# Patient Record
Sex: Male | Born: 1980 | Race: Black or African American | Hispanic: No | Marital: Single | Smoking: Current every day smoker
Health system: Southern US, Community
[De-identification: ages and names within clinical notes are randomized; demographics above are authoritative.]

## PROBLEM LIST (undated history)

## (undated) DIAGNOSIS — M1990 Unspecified osteoarthritis, unspecified site: Secondary | ICD-10-CM

## (undated) HISTORY — DX: Unspecified osteoarthritis, unspecified site: M19.90

---
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
## 2002-08-05 ENCOUNTER — Emergency Department (HOSPITAL_COMMUNITY): Admission: EM | Admit: 2002-08-05 | Discharge: 2002-08-05 | Payer: Self-pay | Admitting: Emergency Medicine

## 2002-08-05 ENCOUNTER — Encounter: Payer: Self-pay | Admitting: Emergency Medicine

## 2002-08-29 ENCOUNTER — Emergency Department (HOSPITAL_COMMUNITY): Admission: EM | Admit: 2002-08-29 | Discharge: 2002-08-30 | Payer: Self-pay | Admitting: Emergency Medicine

## 2003-07-09 ENCOUNTER — Emergency Department (HOSPITAL_COMMUNITY): Admission: EM | Admit: 2003-07-09 | Discharge: 2003-07-09 | Payer: Self-pay

## 2003-07-17 ENCOUNTER — Emergency Department (HOSPITAL_COMMUNITY): Admission: EM | Admit: 2003-07-17 | Discharge: 2003-07-17 | Payer: Self-pay | Admitting: Emergency Medicine

## 2003-07-31 ENCOUNTER — Ambulatory Visit (HOSPITAL_COMMUNITY): Admission: RE | Admit: 2003-07-31 | Discharge: 2003-07-31 | Payer: Self-pay | Admitting: Orthopedic Surgery

## 2003-08-15 ENCOUNTER — Encounter: Admission: RE | Admit: 2003-08-15 | Discharge: 2003-11-13 | Payer: Self-pay | Admitting: Orthopedic Surgery

## 2012-08-27 ENCOUNTER — Observation Stay (HOSPITAL_COMMUNITY)
Admission: EM | Admit: 2012-08-27 | Discharge: 2012-08-29 | DRG: 552 | Disposition: A | Discharge status: Home / Self Care | Payer: No Typology Code available for payment source | Attending: Internal Medicine | Admitting: Internal Medicine

## 2012-08-27 ENCOUNTER — Encounter (HOSPITAL_COMMUNITY): Payer: Self-pay | Admitting: Emergency Medicine

## 2012-08-27 ENCOUNTER — Emergency Department (HOSPITAL_COMMUNITY): Payer: No Typology Code available for payment source

## 2012-08-27 DIAGNOSIS — M549 Dorsalgia, unspecified: Secondary | ICD-10-CM | POA: Diagnosis present

## 2012-08-27 DIAGNOSIS — F17210 Nicotine dependence, cigarettes, uncomplicated: Secondary | ICD-10-CM

## 2012-08-27 DIAGNOSIS — M545 Low back pain, unspecified: Principal | ICD-10-CM | POA: Insufficient documentation

## 2012-08-27 DIAGNOSIS — F1210 Cannabis abuse, uncomplicated: Secondary | ICD-10-CM | POA: Insufficient documentation

## 2012-08-27 DIAGNOSIS — G8929 Other chronic pain: Secondary | ICD-10-CM | POA: Insufficient documentation

## 2012-08-27 DIAGNOSIS — R937 Abnormal findings on diagnostic imaging of other parts of musculoskeletal system: Secondary | ICD-10-CM | POA: Diagnosis present

## 2012-08-27 DIAGNOSIS — M79609 Pain in unspecified limb: Secondary | ICD-10-CM | POA: Insufficient documentation

## 2012-08-27 DIAGNOSIS — F17200 Nicotine dependence, unspecified, uncomplicated: Secondary | ICD-10-CM | POA: Insufficient documentation

## 2012-08-27 DIAGNOSIS — D649 Anemia, unspecified: Secondary | ICD-10-CM | POA: Insufficient documentation

## 2012-08-27 DIAGNOSIS — M546 Pain in thoracic spine: Secondary | ICD-10-CM

## 2012-08-27 DIAGNOSIS — R262 Difficulty in walking, not elsewhere classified: Secondary | ICD-10-CM | POA: Insufficient documentation

## 2012-08-27 LAB — CBC
HCT: 33.8 % — ABNORMAL LOW (ref 39.0–52.0)
Hemoglobin: 11.3 g/dL — ABNORMAL LOW (ref 13.0–17.0)
MCH: 28.6 pg (ref 26.0–34.0)
MCHC: 33.4 g/dL (ref 30.0–36.0)
MCV: 85.6 fL (ref 78.0–100.0)
Platelets: 256 10*3/uL (ref 150–400)
RBC: 3.95 MIL/uL — ABNORMAL LOW (ref 4.22–5.81)
RDW: 14 % (ref 11.5–15.5)
WBC: 7.4 10*3/uL (ref 4.0–10.5)

## 2012-08-27 LAB — URINALYSIS, ROUTINE W REFLEX MICROSCOPIC
Bilirubin Urine: NEGATIVE
Glucose, UA: NEGATIVE mg/dL
Hgb urine dipstick: NEGATIVE
Ketones, ur: NEGATIVE mg/dL
Leukocytes, UA: NEGATIVE
Nitrite: NEGATIVE
Protein, ur: NEGATIVE mg/dL
Specific Gravity, Urine: 1.018 (ref 1.005–1.030)
Urobilinogen, UA: 1 mg/dL (ref 0.0–1.0)
pH: 7.5 (ref 5.0–8.0)

## 2012-08-27 LAB — SEDIMENTATION RATE: Sed Rate: 59 mm/hr — ABNORMAL HIGH (ref 0–16)

## 2012-08-27 LAB — COMPREHENSIVE METABOLIC PANEL
ALT: 6 U/L (ref 0–53)
AST: 8 U/L (ref 0–37)
Albumin: 3.1 g/dL — ABNORMAL LOW (ref 3.5–5.2)
Alkaline Phosphatase: 82 U/L (ref 39–117)
BUN: 12 mg/dL (ref 6–23)
CO2: 28 mEq/L (ref 19–32)
Calcium: 9 mg/dL (ref 8.4–10.5)
Chloride: 102 mEq/L (ref 96–112)
Creatinine, Ser: 0.9 mg/dL (ref 0.50–1.35)
GFR calc Af Amer: >90 mL/min (ref >90)
GFR calc non Af Amer: >90 mL/min (ref >90)
Glucose, Bld: 85 mg/dL (ref 70–99)
Potassium: 3.7 mEq/L (ref 3.5–5.1)
Sodium: 138 mEq/L (ref 135–145)
Total Bilirubin: 0.1 mg/dL — ABNORMAL LOW (ref 0.3–1.2)
Total Protein: 6.8 g/dL (ref 6.0–8.3)

## 2012-08-27 LAB — URIC ACID: Uric Acid, Serum: 4.4 mg/dL (ref 4.0–7.8)

## 2012-08-27 MED ORDER — METHOCARBAMOL 500 MG PO TABS
500.0000 mg | ORAL_TABLET | Freq: Three times a day (TID) | ORAL | Status: DC
  Administered 2012-08-27 – 2012-08-29 (×5): 500 mg via ORAL
  Filled 2012-08-27 (×10): qty 1

## 2012-08-27 MED ORDER — DIAZEPAM 5 MG PO TABS
10.0000 mg | ORAL_TABLET | Freq: Once | ORAL | Status: AC
  Administered 2012-08-27: 10 mg via ORAL
  Filled 2012-08-27: qty 2

## 2012-08-27 MED ORDER — ACETAMINOPHEN 650 MG RE SUPP: 650.0000 mg | Freq: Four times a day (QID) | RECTAL | Status: DC | PRN

## 2012-08-27 MED ORDER — ONDANSETRON HCL 4 MG/2ML IJ SOLN: 4.0000 mg | Freq: Four times a day (QID) | INTRAMUSCULAR | Status: DC | PRN

## 2012-08-27 MED ORDER — ACETAMINOPHEN 325 MG PO TABS: 650.0000 mg | ORAL_TABLET | Freq: Four times a day (QID) | ORAL | Status: DC | PRN

## 2012-08-27 MED ORDER — BISACODYL 5 MG PO TBEC: 10.0000 mg | DELAYED_RELEASE_TABLET | Freq: Every day | ORAL | Status: DC | PRN

## 2012-08-27 MED ORDER — HYDROCODONE-ACETAMINOPHEN 5-325 MG PO TABS
2.0000 | ORAL_TABLET | Freq: Once | ORAL | Status: AC
  Administered 2012-08-27: 2 via ORAL
  Filled 2012-08-27: qty 2

## 2012-08-27 MED ORDER — PANTOPRAZOLE SODIUM 40 MG PO TBEC
40.0000 mg | DELAYED_RELEASE_TABLET | Freq: Two times a day (BID) | ORAL | Status: DC
  Administered 2012-08-27 – 2012-08-29 (×4): 40 mg via ORAL
  Filled 2012-08-27 (×4): qty 1

## 2012-08-27 MED ORDER — SODIUM CHLORIDE 0.9 % IV SOLN: 250.0000 mL | INTRAVENOUS | Status: DC | PRN

## 2012-08-27 MED ORDER — SODIUM CHLORIDE 0.9 % IJ SOLN: 3.0000 mL | INTRAMUSCULAR | Status: DC | PRN

## 2012-08-27 MED ORDER — NAPROXEN 500 MG PO TABS
500.0000 mg | ORAL_TABLET | Freq: Two times a day (BID) | ORAL | Status: DC
  Administered 2012-08-28 – 2012-08-29 (×3): 500 mg via ORAL
  Filled 2012-08-27 (×6): qty 1

## 2012-08-27 MED ORDER — ONDANSETRON HCL 4 MG PO TABS
4.0000 mg | ORAL_TABLET | Freq: Four times a day (QID) | ORAL | Status: DC | PRN
  Administered 2012-08-29: 4 mg via ORAL
  Filled 2012-08-27: qty 1

## 2012-08-27 MED ORDER — SODIUM CHLORIDE 0.9 % IJ SOLN
3.0000 mL | Freq: Two times a day (BID) | INTRAMUSCULAR | Status: DC
  Administered 2012-08-27 – 2012-08-29 (×4): 3 mL via INTRAVENOUS

## 2012-08-27 MED ORDER — OXYCODONE HCL 5 MG PO TABS
5.0000 mg | ORAL_TABLET | ORAL | Status: DC | PRN
  Administered 2012-08-28: 10 mg via ORAL
  Administered 2012-08-28: 5 mg via ORAL
  Administered 2012-08-28 (×2): 10 mg via ORAL
  Administered 2012-08-28: 5 mg via ORAL
  Administered 2012-08-28 – 2012-08-29 (×3): 10 mg via ORAL
  Filled 2012-08-27 (×2): qty 2
  Filled 2012-08-27 (×2): qty 1
  Filled 2012-08-27 (×4): qty 2

## 2012-08-27 MED ORDER — ENOXAPARIN SODIUM 40 MG/0.4ML ~~LOC~~ SOLN
40.0000 mg | SUBCUTANEOUS | Status: DC
  Administered 2012-08-27 – 2012-08-28 (×2): 40 mg via SUBCUTANEOUS
  Filled 2012-08-27 (×3): qty 0.4

## 2012-08-27 NOTE — ED Provider Notes (Signed)
CSN: [id number]     Arrival date & time 08/27/12  1211 History     First MD Initiated Contact with Patient 08/27/12 1234     Chief Complaint  Patient presents with  . Back Pain   (Consider location/radiation/quality/duration/timing/severity/associated sxs/prior Treatment) HPI Pt is a 32yo male with hx of chronic back pain c/o left leg pain that worsened this morning.  Pt states pain prevents him from walking, states he is unable to bear weight on left leg, as it causes increased pain in left hip.  Pain is constant, sharp, throbbing, aching, worse with walking, 10/10.  Has tried Aleve which provided moderate relief in past but none today. 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  Denies recent trauma or fall.  Denies fever, n/v/d, abdominal pain, or dysuria.    History reviewed. No pertinent past medical history. History reviewed. No pertinent past surgical history. History reviewed. No pertinent family history. History  Substance Use Topics  . Smoking status: Current Every Day Smoker    Types: Cigarettes  . Smokeless tobacco: Not on file  . Alcohol Use: Yes    Review of Systems  Constitutional: Negative for fever, chills and fatigue.  Cardiovascular: Negative for leg swelling.  Musculoskeletal: Positive for myalgias, back pain and arthralgias.       Left leg   Neurological: Negative for weakness and numbness.  All other systems reviewed and are negative.    Allergies  Aspirin  Home Medications   Current Outpatient Rx  Name  Route  Sig  Dispense  Refill  . naproxen sodium (ANAPROX) 220 MG tablet   Oral   Take 440 mg by mouth daily as needed (for pain).          BP 127/80  Pulse 80  Temp(Src) 98.2 F (36.8 C) (Oral)  Resp 18  SpO2 100% Physical Exam  Nursing note and vitals reviewed. Constitutional: He appears well-developed and well-nourished.  Pt sitting comfortably in  exam bed, NAD.     HENT:  Head: Normocephalic and atraumatic.  Eyes: Conjunctivae are normal. No scleral icterus.  Neck: Normal range of motion.  Cardiovascular: Normal rate, regular rhythm and normal heart sounds.   Pulmonary/Chest: Effort normal and breath sounds normal. No respiratory distress. He has no wheezes. He has no rales. He exhibits no tenderness.  Abdominal: Soft. Bowel sounds are normal. He exhibits no distension and no mass. There is no tenderness. There is no rebound and no guarding.  Musculoskeletal: Normal range of motion. He exhibits tenderness ( left lower lumbar muscles, left lateral thigh (mild)). He exhibits no edema.  Nl ROM at hip joint. No obvious deformity.  Neg straight leg raise. Neurovascularly in tact distal to area of pain.   Neurological: He is alert. A sensory deficit is present.  4/5 Left hip flexion, 5/5 right hip flexion. 5/5 bilateral plantar flexion and dorsiflexion   Skin: Skin is warm and dry.  Skin in tact, no ecchymosis, erythema, rashes or lesions    ED Course   Procedures (including critical care time)  Labs Reviewed  CBC - Abnormal; Notable for the following:    RBC 3.95 (*)    Hemoglobin 11.3 (*)    HCT 33.8 (*)    All other components within normal limits  CULTURE, BLOOD (ROUTINE X 2)  CULTURE, BLOOD (ROUTINE X 2)  COMPREHENSIVE METABOLIC PANEL  URINALYSIS, ROUTINE W REFLEX MICROSCOPIC  SEDIMENTATION RATE  RHEUMATOID FACTOR  ANA  URIC ACID   Mr Lumbar Spine Wo Contrast  08/27/2012   *RADIOLOGY REPORT*  Clinical Data: Severe left leg pain.  MRI LUMBAR SPINE WITHOUT CONTRAST  Technique:  Multiplanar and multiecho pulse sequences of the lumbar spine were obtained without intravenous contrast.  Comparison: Lumbar MRI dated 10/31/2003  Findings: Normal conus tip at L1-2.  The patient has unusual focal areas of joint inflammation and soft tissue inflammation including the paraspinal musculature at T11-12 and around the tip of the spinous  process of T10 as well as of the right facet joint at L2-3 and both facet joints at L4-5.  There is also a focal 11 mm lesion in the right pedicle of T12.  All of these findings are new since the prior study.  T10-11 through L5-S1:  The discs are all normal.  There is no bulging, protrusion, foraminal stenosis or spinal stenosis.  IMPRESSION:  1.  Multi focal facet joint inflammation in the lumbar spine, new since the prior study. 2.  Focal inflammation in the posterior soft tissues at T10-11 and T11-12. 3.  Small nonspecific lesion in the right pedicle of T12.  Possible inflammation at the left costovertebral joint at T11. 4.  The possibility of joint infections, possibly atypical, should be considered.  Systemic arthritis is felt to be less likely.  Has the patient had any trauma? 5.  Normal discs throughout the lower thoracic and lumbar spine.   Original Report Authenticated By: Francene Boyers, M.D.   No diagnosis found.  MDM  Pt has chronic LBP presenting to ED with left leg pain, requesting MRI.  Reviewed MRI from 2005, nl.  Pt was dx with lumbar strain.  No new injury to area.  Discussed pt with Dr. Ignacia Palma, will get repeat MR lumbar spine as pt claims unable to walk due to pain.    MRI lumbar spine: indicates multiple changes since last MR of lumbar spine including possibility of joint infections.   Discussed findings with Dr. Ignacia Palma who consulted Dr. Franky Macho, neurosurgery, for further direction.    Dr. Mikal Plane stating pt is not a surgical case at this time, suggested labs be drawn on pt: CBC, CMP, Sed Rate, Rheumatoid factor, ANA, uric acid, blood cultures, and UA.    4:44 PM Labs pending.  Signed out to [employer] PA-C at shift change.  Pt is to be admitted to medicine, stable enough for medsurg.     Junius Finner, PA-C 08/27/12 1644

## 2012-08-27 NOTE — H&P (Addendum)
[facility] History and Physical  RENDER MARLEY ZOX:[id number] DOB: 1980-10-21 DOA: 08/27/2012  Referring physician: PCP: No PCP Per Patient  Specialists:   Chief Complaint: severe L. Leg pain with difficulty ambulating  HPI: Jimmy Peterson is a 32 y.o. male with a long standing(since 2005) h/o  Back pain- no prior back sugeries and no trauma,  who presents with the above complaints. He states that today when he woke up the pain going down his leg(the side) was so severe that he was unable to bear weight on that leg- described as throbbing, esp. worse down his L. Hip and thigh and aggravated by walking. He tried taking aleve, but no relief. He denies any fall/trauma or recent heavy lifting. 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 Admits to some back pain on and off today, and intermittent tingling down his leg. States leg feels slightly weak, but that mostly the pain is making it difficult for him to walk. Denies fever, dysuria, n/v, and no URI or cold symptoms. He also denies  He states he played sports in High school but did some wt lifting but does not recall any significant injuries. In the ED MRI of lumbar spine Multi focal facet joint inflammation in the lumbar spine, Focal inflammation in the posterior soft tissues at T10-11 and T11-12., Small nonspecific lesion in the right pedicle of T12. Possible inflammation at the left costovertebral joint at T11- consider possibility of joint infections,  Systemic arthritis felt to be less likely. NS/Dr Franky Macho was consulted >> no indication for surgical intervention and recommended admission to [facility] to r/o infection .Pt is afebrile, WBC 7.4.     Review of Systems: The patient denies anorexia, fever, weight loss, vision loss, decreased hearing, hoarseness, chest pain, syncope, dyspnea on exertion, peripheral edema, balance deficits, hemoptysis, abdominal pain, melena, hematochezia, severe  indigestion/heartburn, hematuria, incontinence, genital sores, muscle weakness, suspicious skin lesions, transient blindness, difficulty walking, depression, unusual weight change, abnormal bleeding, enlarged lymph nodes, angioedema, and breast masses.    History reviewed. No pertinent past medical history. History reviewed. No pertinent past surgical history. Social History:  reports that he has been smoking Cigarettes.  He has a 3.25 pack-year smoking history. He does not have any smokeless tobacco history on file. He reports that  drinks alcohol. He reports that he uses illicit drugs (Marijuana) about once per week.  where does patient live--home Can patient participate in ADLs  Allergies  Allergen Reactions  . Aspirin Hives    Family History  Problem Relation Age of Onset  . Diabetes Mother   . Heart disease Mother     Prior to Admission medications   Medication Sig Start Date End Date Taking? Authorizing Provider  naproxen sodium (ANAPROX) 220 MG tablet Take 440 mg by mouth daily as needed (for pain).   Yes Historical Provider, MD   Physical Exam: Filed Vitals:   08/27/12 1700 08/27/12 1730 08/27/12 1815 08/27/12 1910  BP: 114/71 109/59 108/74 122/70  Pulse: 51 51 53 55  Temp:    97.4 F (36.3 C)  TempSrc:    Oral  Resp:   18 20  Height:   5\' 7"  (1.702 m)   Weight:   77.111 kg (170 lb)   SpO2: 100% 99% 100% 100%   Constitutional: Vital signs reviewed.  Patient is a well-developed and well-nourished in no acute distress and cooperative with exam. Alert and oriented x3.  Head: Normocephalic and atraumatic Mouth:  no erythema or exudates, MMM Eyes: PERRL, EOMI, conjunctivae normal(no erythema), No scleral icterus.  Neck: Supple, Trachea midline normal ROM, No JVD, mass, thyromegaly, or carotid bruit present.  Cardiovascular: RRR, S1 normal, S2 normal, no MRG, pulses symmetric and intact bilaterally Pulmonary/Chest: normal respiratory effort, CTAB, no wheezes, rales, or  rhonchi Abdominal: Soft. Non-tender, non-distended, bowel sounds are normal, no masses, organomegaly, or guarding present.  Back: tenderness from midthoracic spine down to upper lumbar spine,with paraspinal muscle tightness and decrease ROM Extremities: SLR only to about 5-10 degrees on L, to about 40degrees on R, no cyanosis and no edema Neurological: A&O x3, Strength 4-5/5, cranial nerve II-XII are grossly intact, no focal motor deficit, sensory intact to light touch bilaterally.  Skin: Warm, dry and intact. No rash, cyanosis.  Psychiatric: Normal mood and affect. speech and behavior is normal. Judgment and thought content normal. Cognition and memory are normal.      Labs on Admission:  Basic Metabolic Panel:  Recent Labs Lab 08/27/12 1601  NA 138  K 3.7  CL 102  CO2 28  GLUCOSE 85  BUN 12  CREATININE 0.90  CALCIUM 9.0   Liver Function Tests:  Recent Labs Lab 08/27/12 1601  AST 8  ALT 6  ALKPHOS 82  BILITOT 0.1*  PROT 6.8  ALBUMIN 3.1*   No results found for this basename: LIPASE, AMYLASE,  in the last 168 hours No results found for this basename: AMMONIA,  in the last 168 hours CBC:  Recent Labs Lab 08/27/12 1601  WBC 7.4  HGB 11.3*  HCT 33.8*  MCV 85.6  PLT 256   Cardiac Enzymes: No results found for this basename: CKTOTAL, CKMB, CKMBINDEX, TROPONINI,  in the last 168 hours  BNP (last 3 results) No results found for this basename: PROBNP,  in the last 8760 hours CBG: No results found for this basename: GLUCAP,  in the last 168 hours  Radiological Exams on Admission: Mr Lumbar Spine Wo Contrast  08/27/2012   *RADIOLOGY REPORT*  Clinical Data: Severe left leg pain.  MRI LUMBAR SPINE WITHOUT CONTRAST  Technique:  Multiplanar and multiecho pulse sequences of the lumbar spine were obtained without intravenous contrast.  Comparison: Lumbar MRI dated 10/31/2003  Findings: Normal conus tip at L1-2.  The patient has unusual focal areas of joint inflammation  and soft tissue inflammation including the paraspinal musculature at T11-12 and around the tip of the spinous process of T10 as well as of the right facet joint at L2-3 and both facet joints at L4-5.  There is also a focal 11 mm lesion in the right pedicle of T12.  All of these findings are new since the prior study.  T10-11 through L5-S1:  The discs are all normal.  There is no bulging, protrusion, foraminal stenosis or spinal stenosis.  IMPRESSION:  1.  Multi focal facet joint inflammation in the lumbar spine, new since the prior study. 2.  Focal inflammation in the posterior soft tissues at T10-11 and T11-12. 3.  Small nonspecific lesion in the right pedicle of T12.  Possible inflammation at the left costovertebral joint at T11. 4.  The possibility of joint infections, possibly atypical, should be considered.  Systemic arthritis is felt to be less likely.  Has the patient had any trauma? 5.  Normal discs throughout the lower thoracic and lumbar spine.   Original Report Authenticated By: Francene Boyers, M.D.     Assessment/Plan Active Problems: Back pain/Severe L. Leg pain with Abnormal MRI, thoracic spine -  admit to med surge for pain control and futher eval - Pt is afebrile with no leukcytosis or known predisposing factors to vertebral infections- will hold off abx for now, I have discussed pt with Dr Orvan Falconer and he agrees with no abx and to check with IR for possibility of aspiration>>culture - Dicussed pt with IR- Dr Fredia Sorrow and following review of films he states he does not see anything that can be aspirated per IR -will obtain ESR, CRP, ANA, Rh factor, HLA B27 and follow - as above per NS no indication for surgery - pain management-NSAIDs, muscle relaxers, narcotics prn and follow Marijuana abuse -sw consult for resources to quit Chronic back pain -pain management as above and follow   Code Status: full Family Communication: mother at bedside Disposition Plan: admit to med surge  Time  spent: >30  Kela Millin [facility] Pager [phone]  If 7PM-7AM, please contact night-coverage [email or web address] Password [facility] 08/27/2012, 8:52 PM

## 2012-08-27 NOTE — Progress Notes (Signed)
Met Patient at bedside.Case Manager role explained,patient verbalizes understanding.Patient reports he does not have a PCP. Resource sheet provided- to patient about the [facility] clinic on [address].No further Case Management needs identified.

## 2012-08-27 NOTE — ED Provider Notes (Signed)
Patient signed out to me by Junius Finner, PA-C, with the plan to admit the patient to medicine for further workup of back pain.  Neurosurgery has been consulted.  Not a surgical issue at this time.  They recommend medicine admit for infectious rule out.  Patient discussed with Dr. Suanne Marker, who will admit the patient.  Roxy Horseman, PA-C 08/27/12 1806

## 2012-08-27 NOTE — ED Notes (Signed)
Pt arrives to ed co left sided back pain. Pt sts seen for same over past 5 years.  Pt denies recent illness/injury/trauma.  Coax4, pmsx4. nad,

## 2012-08-28 DIAGNOSIS — F17210 Nicotine dependence, cigarettes, uncomplicated: Secondary | ICD-10-CM | POA: Diagnosis present

## 2012-08-28 DIAGNOSIS — F17200 Nicotine dependence, unspecified, uncomplicated: Secondary | ICD-10-CM

## 2012-08-28 DIAGNOSIS — D649 Anemia, unspecified: Secondary | ICD-10-CM | POA: Diagnosis present

## 2012-08-28 LAB — BASIC METABOLIC PANEL
BUN: 13 mg/dL (ref 6–23)
CO2: 29 mEq/L (ref 19–32)
Calcium: 8.5 mg/dL (ref 8.4–10.5)
Creatinine, Ser: 1.1 mg/dL (ref 0.50–1.35)
Glucose, Bld: 91 mg/dL (ref 70–99)
Sodium: 141 mEq/L (ref 135–145)

## 2012-08-28 LAB — ANA: Anti Nuclear Antibody(ANA): NEGATIVE

## 2012-08-28 LAB — RHEUMATOID FACTOR: Rheumatoid fact SerPl-aCnc: <10 IU/mL (ref <=14)

## 2012-08-28 NOTE — Consult Note (Signed)
[facility]    Date of Admission:  08/27/2012          Reason for Consult: Acute low back pain with radiation to left leg    Referring Physician: Dr. Gaynelle Adu  Principal Problem:   Abnormal MRI, thoracic spine Active Problems:   Back pain   Cigarette smoker   Normocytic anemia   . enoxaparin (LOVENOX) injection  40 mg Subcutaneous Q24H  . methocarbamol  500 mg Oral TID  . naproxen  500 mg Oral BID WC  . pantoprazole  40 mg Oral BID  . sodium chloride  3 mL Intravenous Q12H    Recommendations: 1. Observe off of antibiotics   Assessment: I reviewed his lumbar MRI with Dr. Malachy Moan. He also reviewed the films with Dr. Lonia Skinner. There is some inflammation and some of the thoracic and lumbar facet joints and 2 areas of soft tissue inflammation but no fluid to aspirate. All disc spaces and vertebrae appear normal. This is not the picture of vertebral infection and I would not start any empiric antibiotic therapy. I am not sure what is causing the inflammation but he seems to be significantly better today and I would simply continue observation off of antibiotics. He is asking when he might be able to go home so it appears that he is improving and can be followed up as an outpatient. Please call if I can be of further assistance.    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 He had a severe episode in 2005 and was incapacitated for about 3 months at home. His family finally convinced him to see a doctor who did all sorts of tests and told him it wasn't certain what was causing his pain. His pain slowly resolved and he was able to return to normal activities. He has had maybe 2 episodes of very brief, severe back pain in the intervening years. He woke yesterday with severe low back pain radiating into his left leg. He has not had any recent  injuries. He has not had any fever or chills and has a normal appetite. He was admitted last night and underwent MRI scanning. He is feeling significantly better this morning.   Review of Systems: Constitutional: negative Eyes: negative Ears, nose, mouth, throat, and face: negative Respiratory: negative Cardiovascular: negative Gastrointestinal: negative Genitourinary:negative Musculoskeletal:positive for back pain and muscle weakness, negative for myalgias, neck pain and stiff joints  History reviewed. No pertinent past medical history.  History  Substance Use Topics  . Smoking status: Current Every Day Smoker -- 0.25 packs/day for 13 years    Types: Cigarettes  . Smokeless tobacco: Not on file  . Alcohol Use: 0.0 oz/week    0 Cans of beer, 0 Shots of liquor per week    Family History  Problem Relation Age of Onset  . Diabetes Mother   . Heart disease Mother    Allergies  Allergen Reactions  . Aspirin Hives    OBJECTIVE: Blood pressure 108/59, pulse 58, temperature 98 F (36.7 C), temperature source Oral, resp. rate 17, height 5\' 7"  (1.702 m), weight 77.111 kg (170 lb), SpO2 100.00%. General: He is a healthy-appearing young male. He is walking in his room without obvious discomfort Skin: No rash Lymph nodes: No palpable adenopathy Lungs: Clear Cor: Regular S1 and S2 and no  murmurs Abdomen: Soft and nontender without masses Joints and extremities: No tenderness, swelling or redness over her spine. No acute abnormalities of any joints Lab Results  Component Value Date   WBC 7.4 08/27/2012   HGB 11.3* 08/27/2012   HCT 33.8* 08/27/2012   MCV 85.6 08/27/2012   PLT 256 08/27/2012   BMET    Component Value Date/Time   NA 141 08/28/2012 0645   K 3.8 08/28/2012 0645   CL 106 08/28/2012 0645   CO2 29 08/28/2012 0645   GLUCOSE 91 08/28/2012 0645   BUN 13 08/28/2012 0645   CREATININE 1.10 08/28/2012 0645   CALCIUM 8.5 08/28/2012 0645   GFRNONAA 88* 08/28/2012 0645   GFRAA >90  08/28/2012 0645   Lab Results  Component Value Date   CRP 4.0* 08/27/2012   Lab Results  Component Value Date   ESRSEDRATE 59* 08/27/2012   Microbiology: No results found for this or any previous visit (from the past 240 hour(s)).  Cliffton Asters, MD [facility] [phone] pager   [phone] cell 08/28/2012, 12:41 PM

## 2012-08-28 NOTE — Progress Notes (Addendum)
[facility] PROGRESS NOTE  Jimmy Peterson YNW:[id number] DOB: 1980/12/20 DOA: 08/27/2012 PCP: No PCP Per Patient  Assessment/Plan: Back pain/Severe L. Leg pain with Abnormal MRI, thoracic spine  -admitted to med surge for pain control and futher eval  - Pt remains afebrile with no leukcytosis or known predisposing factors to vertebral infections- continue to hold off abx for now, Dr Orvan Falconer and he agrees with no abx and to check with IR for possibility of aspiration>>culture  - Dicussed pt with IR on admission- Dr Fredia Sorrow and following review of films he states he does not see anything that can be aspirated per IR  -ESR, CRP, Rh factor- unremarkable, HLA B27 ANA pending - as above per NS no indication for surgery  - -Pt improving clinically, continue pain management-NSAIDs, muscle relaxers, narcotics prn and follow  -PT consult Marijuana abuse  -sw consulted on admission for resources to quit  Chronic back pain  -pain management as above and follow   Code Status: full Family Communication: mother at bedside Disposition Plan: to home when medicall y stable   Consultants:  ID eval pending  Procedures:  none  Antibiotics:  none  HPI/Subjective: Pt states decreased back  Objective: Filed Vitals:   08/27/12 1815 08/27/12 1910 08/27/12 2223 08/28/12 0601  BP: 108/74 122/70 110/66 108/59  Pulse: 53 55 56 58  Temp:  97.4 F (36.3 C) 98 F (36.7 C) 98 F (36.7 C)  TempSrc:  Oral Oral   Resp: 18 20 17 17   Height: 5\' 7"  (1.702 m)     Weight: 77.111 kg (170 lb)     SpO2: 100% 100% 100% 100%    Intake/Output Summary (Last 24 hours) at 08/28/12 1128 Last data filed at 08/28/12 0840  Gross per 24 hour  Intake    360 ml  Output      2 ml  Net    358 ml   Filed Weights   08/27/12 1815  Weight: 77.111 kg (170 lb)   Exam:  General: alert & oriented x  In NAD Cardiovascular: RRR, nl S1 s2 Respiratory: CTAB Abdomen: soft +BS NT/ND, no masses  palpable BACK: decreased tenderness over thoracic and lumbar spine Extremities: No cyanosis and no edema. LLE SLR now to 30 degrees     Data Reviewed: Basic Metabolic Panel:  Recent Labs Lab 08/27/12 1601 08/28/12 0645  NA 138 141  K 3.7 3.8  CL 102 106  CO2 28 29  GLUCOSE 85 91  BUN 12 13  CREATININE 0.90 1.10  CALCIUM 9.0 8.5   Liver Function Tests:  Recent Labs Lab 08/27/12 1601  AST 8  ALT 6  ALKPHOS 82  BILITOT 0.1*  PROT 6.8  ALBUMIN 3.1*   No results found for this basename: LIPASE, AMYLASE,  in the last 168 hours No results found for this basename: AMMONIA,  in the last 168 hours CBC:  Recent Labs Lab 08/27/12 1601  WBC 7.4  HGB 11.3*  HCT 33.8*  MCV 85.6  PLT 256   Cardiac Enzymes: No results found for this basename: CKTOTAL, CKMB, CKMBINDEX, TROPONINI,  in the last 168 hours BNP (last 3 results) No results found for this basename: PROBNP,  in the last 8760 hours CBG: No results found for this basename: GLUCAP,  in the last 168 hours  No results found for this or any previous visit (from the past 240 hour(s)).   Studies: Mr Lumbar Spine Wo Contrast  08/27/2012   *RADIOLOGY REPORT*  Clinical Data:  Severe left leg pain.  MRI LUMBAR SPINE WITHOUT CONTRAST  Technique:  Multiplanar and multiecho pulse sequences of the lumbar spine were obtained without intravenous contrast.  Comparison: Lumbar MRI dated 10/31/2003  Findings: Normal conus tip at L1-2.  The patient has unusual focal areas of joint inflammation and soft tissue inflammation including the paraspinal musculature at T11-12 and around the tip of the spinous process of T10 as well as of the right facet joint at L2-3 and both facet joints at L4-5.  There is also a focal 11 mm lesion in the right pedicle of T12.  All of these findings are new since the prior study.  T10-11 through L5-S1:  The discs are all normal.  There is no bulging, protrusion, foraminal stenosis or spinal stenosis.  IMPRESSION:   1.  Multi focal facet joint inflammation in the lumbar spine, new since the prior study. 2.  Focal inflammation in the posterior soft tissues at T10-11 and T11-12. 3.  Small nonspecific lesion in the right pedicle of T12.  Possible inflammation at the left costovertebral joint at T11. 4.  The possibility of joint infections, possibly atypical, should be considered.  Systemic arthritis is felt to be less likely.  Has the patient had any trauma? 5.  Normal discs throughout the lower thoracic and lumbar spine.   Original Report Authenticated By: Francene Boyers, M.D.    Scheduled Meds: . enoxaparin (LOVENOX) injection  40 mg Subcutaneous Q24H  . methocarbamol  500 mg Oral TID  . naproxen  500 mg Oral BID WC  . pantoprazole  40 mg Oral BID  . sodium chloride  3 mL Intravenous Q12H   Continuous Infusions:   Principal Problem:   Abnormal MRI, thoracic spine Active Problems:   Back pain   Cigarette smoker   Normocytic anemia    Time spent: 25    [facility]  [facility] Pager [phone]. If 7PM-7AM, please contact night-coverage at [email or web address], password [facility] 08/28/2012, 11:28 AM  LOS: 1 day

## 2012-08-29 LAB — BASIC METABOLIC PANEL
CO2: 30 mEq/L (ref 19–32)
Chloride: 102 mEq/L (ref 96–112)
Creatinine, Ser: 1.07 mg/dL (ref 0.50–1.35)

## 2012-08-29 MED ORDER — METHOCARBAMOL 500 MG PO TABS: 500.0000 mg | ORAL_TABLET | Freq: Three times a day (TID) | ORAL | Status: DC | PRN

## 2012-08-29 MED ORDER — OXYCODONE HCL 5 MG PO TABS: 5.0000 mg | ORAL_TABLET | ORAL | Status: DC | PRN

## 2012-08-29 MED ORDER — OMEPRAZOLE 40 MG PO CPDR: 40.0000 mg | DELAYED_RELEASE_CAPSULE | Freq: Every day | ORAL | Status: DC

## 2012-08-29 MED ORDER — NAPROXEN 500 MG PO TABS: 500.0000 mg | ORAL_TABLET | Freq: Two times a day (BID) | ORAL | Status: DC

## 2012-08-29 NOTE — Discharge Summary (Signed)
Physician Discharge Summary  Jimmy Peterson:[id number] DOB: 11-Dec-1980 DOA: 08/27/2012  PCP: No PCP Per Patient  Admit date: 08/27/2012 Discharge date: 08/29/2012  Time spent: <30 minutes  Recommendations for Outpatient Follow-up:      Follow-up Information   Please follow up. ([facility] in[facility]2weeks call for appt)       Follow up labs Follow HLA b27 level pending on discharge  Discharge Diagnoses:  Principal Problem:   Abnormal MRI, thoracic spine Active Problems:   Back pain   Cigarette smoker   Normocytic anemia   Discharge Condition: improved/stable  Diet recommendation: regular  Filed Weights   08/27/12 1815  Weight: 77.111 kg (170 lb)    History of present illness:  Jimmy Peterson is a 32 y.o. male with a long standing(since 2005) h/o Back pain- no prior back sugeries and no trauma, who presents with the above complaints. He states that today when he woke up the pain going down his leg(the side) was so severe that he was unable to bear weight on that leg- described as throbbing, esp. worse down his L. Hip and thigh and aggravated by walking. He tried taking aleve, but no relief. He denies any fall/trauma or recent heavy lifting. 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 Admits to some back pain on and off today, and intermittent tingling down his leg. States leg feels slightly weak, but that mostly the pain is making it difficult for him to walk. Denies fever, dysuria, n/v, and no URI or cold symptoms. He also denies He states he played sports in High school but did some wt lifting but does not recall any significant injuries. In the ED MRI of lumbar spine Multi focal facet joint inflammation in the lumbar spine, Focal inflammation in the posterior soft tissues at T10-11 and T11-12., Small nonspecific lesion in the right pedicle of T12. Possible inflammation at the left costovertebral  joint at T11- consider possibility of joint infections, Systemic arthritis felt to be less likely. NS/Dr Franky Macho was consulted >> no indication for surgical intervention and recommended admission to [facility] to r/o infection .Pt is afebrile, WBC 7.4.    Hospital Course:  Active Problems:  Back pain/Severe L. Leg pain with Abnormal MRI, thoracic spine  -pt was admitted to med surge for pain control and futher eval  - Pt is afebrile with no leukcytosis or known predisposing factors to vertebral infections- he was observed off abx. I  discussed pt with Dr Orvan Falconer and he agreed with no abx and to check with IR for possibility of aspiration>>culture. - Dicussed pt with IR- Dr Fredia Sorrow and following review of films he states he does not see anything that can be aspirated per IR.  Dr Orvan Falconer also folloed and reviewed fils with 2other radiologists and they agreed there was nothing to aspirate - ESR, CRP- mildyly elevated, ANA, Rh factor were neg, HLA B27  Pending and pt follow up with PCP for results and further management. - as above per NS no indication for surgery  - pain was managed-NSAIDs, muscle relaxers, narcotics prn and follow  -He improved clinically and was able to ambulate without difficulty Marijuana abuse  -sw consult for resources to quit  Chronic back pain  -pain management as above     Procedures:  none  Consultations:  ID  Discharge Exam: Filed Vitals:   08/28/12 0601 08/28/12 1358 08/28/12 2106 08/29/12 0602  BP: 108/59 111/64 113/78  105/61  Pulse: 58 51 50 55  Temp: 98 F (36.7 C) 97.5 F (36.4 C) 97.9 F (36.6 C) 97.6 F (36.4 C)  TempSrc:  Oral Oral   Resp: 17 16 17 18   Height:      Weight:      SpO2: 100% 100% 100% 96%    Exam:  General: alert & oriented x In NAD  Cardiovascular: RRR, nl S1 s2  Respiratory: CTAB  Abdomen: soft +BS NT/ND, no masses palpable  BACK: decreased tenderness over thoracic and lumbar spine  Extremities: No cyanosis and no edema.  LLE SLR now to 30 degrees    Discharge Instructions  Discharge Orders   Future Orders Complete By Expires     Diet general  As directed     Increase activity slowly  As directed         Medication List    STOP taking these medications       naproxen sodium 220 MG tablet  Commonly known as:  ANAPROX      TAKE these medications       methocarbamol 500 MG tablet  Commonly known as:  ROBAXIN  Take 1 tablet (500 mg total) by mouth 3 (three) times daily as needed.     naproxen 500 MG tablet  Commonly known as:  NAPROSYN  Take 1 tablet (500 mg total) by mouth 2 (two) times daily with a meal.     oxyCODONE 5 MG immediate release tablet  Commonly known as:  Oxy IR/ROXICODONE  Take 1-2 tablets (5-10 mg total) by mouth every 4 (four) hours as needed.       Allergies  Allergen Reactions  . Aspirin Hives       Follow-up Information   Please follow up. ([facility] in[facility] call for appt)        The results of significant diagnostics from this hospitalization (including imaging, microbiology, ancillary and laboratory) are listed below for reference.    Significant Diagnostic Studies: Mr Lumbar Spine Wo Contrast  08/27/2012   *RADIOLOGY REPORT*  Clinical Data: Severe left leg pain.  MRI LUMBAR SPINE WITHOUT CONTRAST  Technique:  Multiplanar and multiecho pulse sequences of the lumbar spine were obtained without intravenous contrast.  Comparison: Lumbar MRI dated 10/31/2003  Findings: Normal conus tip at L1-2.  The patient has unusual focal areas of joint inflammation and soft tissue inflammation including the paraspinal musculature at T11-12 and around the tip of the spinous process of T10 as well as of the right facet joint at L2-3 and both facet joints at L4-5.  There is also a focal 11 mm lesion in the right pedicle of T12.  All of these findings are new since the prior study.  T10-11 through L5-S1:  The discs are all normal.  There is no  bulging, protrusion, foraminal stenosis or spinal stenosis.  IMPRESSION:  1.  Multi focal facet joint inflammation in the lumbar spine, new since the prior study. 2.  Focal inflammation in the posterior soft tissues at T10-11 and T11-12. 3.  Small nonspecific lesion in the right pedicle of T12.  Possible inflammation at the left costovertebral joint at T11. 4.  The possibility of joint infections, possibly atypical, should be considered.  Systemic arthritis is felt to be less likely.  Has the patient had any trauma? 5.  Normal discs throughout the lower thoracic and lumbar spine.   Original Report Authenticated By: Francene Boyers, M.D.    Microbiology: Recent Results (  from the past 240 hour(s))  CULTURE, BLOOD (ROUTINE X 2)     Status: None   Collection Time    08/27/12  4:30 PM      Result Value Range Status   Specimen Description BLOOD RIGHT ARM   Final   Special Requests BOTTLES DRAWN AEROBIC ONLY 10CC   Final   Culture  Setup Time 08/28/2012 01:48   Final   Culture     Final   Value:        BLOOD CULTURE RECEIVED NO GROWTH TO DATE CULTURE WILL BE HELD FOR 5 DAYS BEFORE ISSUING A FINAL NEGATIVE REPORT   Report Status PENDING   Incomplete  CULTURE, BLOOD (ROUTINE X 2)     Status: None   Collection Time    08/27/12  4:30 PM      Result Value Range Status   Specimen Description BLOOD LEFT ARM   Final   Special Requests BOTTLES DRAWN AEROBIC ONLY 5CC   Final   Culture  Setup Time 08/28/2012 01:48   Final   Culture     Final   Value:        BLOOD CULTURE RECEIVED NO GROWTH TO DATE CULTURE WILL BE HELD FOR 5 DAYS BEFORE ISSUING A FINAL NEGATIVE REPORT   Report Status PENDING   Incomplete     Labs: Basic Metabolic Panel:  Recent Labs Lab 08/27/12 1601 08/28/12 0645 08/29/12 0415  NA 138 141 138  K 3.7 3.8 3.5  CL 102 106 102  CO2 28 29 30   GLUCOSE 85 91 75  BUN 12 13 12   CREATININE 0.90 1.10 1.07  CALCIUM 9.0 8.5 8.9   Liver Function Tests:  Recent Labs Lab 08/27/12 1601   AST 8  ALT 6  ALKPHOS 82  BILITOT 0.1*  PROT 6.8  ALBUMIN 3.1*   No results found for this basename: LIPASE, AMYLASE,  in the last 168 hours No results found for this basename: AMMONIA,  in the last 168 hours CBC:  Recent Labs Lab 08/27/12 1601  WBC 7.4  HGB 11.3*  HCT 33.8*  MCV 85.6  PLT 256   Cardiac Enzymes: No results found for this basename: CKTOTAL, CKMB, CKMBINDEX, TROPONINI,  in the last 168 hours BNP: BNP (last 3 results) No results found for this basename: PROBNP,  in the last 8760 hours CBG: No results found for this basename: GLUCAP,  in the last 168 hours     Signed:  Akul Leggette C  [facility] 08/29/2012, 1:29 PM

## 2012-08-29 NOTE — Evaluation (Signed)
Physical Therapy Evaluation Patient Details Name: Jimmy Peterson MRN: [id number] DOB: 04-02-1980 Today's Date: 08/29/2012 Time: 9147-8295 PT Time Calculation (min): 16 min  PT Assessment / Plan / Recommendation History of Present Illness  Jimmy Peterson is a 32 y.o. male with a long standing(since 2005) h/o  Back pain- no prior back sugeries and no trauma,  who presents with the above complaints. He states that today when he woke up the pain going down his leg(the side) was so severe that he was unable to bear weight on that leg- described as throbbing, esp. worse down his L. Hip and thigh and aggravated by walking. He tried taking aleve, but no relief. He denies any fall/trauma or recent heavy lifting. 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 Admits to some back pain on and off today, and intermittent tingling down his leg. States leg feels slightly weak, but that mostly the pain is making it difficult for him to walk. Denies fever, dysuria, n/v, and no URI or cold symptoms. He also denies  He states he played sports in High school but did some wt lifting but does not recall any significant injuries. In the ED MRI of lumbar spine Multi focal facet joint inflammation in the lumbar spine.  Clinical Impression  Pt very agreeable and willing to get up.  Pt currently independent and no pain during ambulation.  Pt has no further PT needs and PT will sign off.     PT Assessment  Patent does not need any further PT services    Follow Up Recommendations  No PT follow up    Equipment Recommendations  None recommended by PT    Precautions / Restrictions Precautions Precautions: None   Pertinent Vitals/Pain No c/o pain      Mobility  Bed Mobility Bed Mobility: Supine to Sit Supine to Sit: 7: Independent Transfers Transfers: Sit to Stand;Stand to Sit Sit to Stand: 7: Independent;From bed Stand to Sit: 7: Independent;To  chair/3-in-1 Ambulation/Gait Ambulation/Gait Assistance: 7: Independent Ambulation Distance (Feet): 300 Feet Assistive device: None Gait Pattern: Within Functional Limits Gait velocity: wfl Stairs: Yes Stairs Assistance: 6: Modified independent (Device/Increase time) Stair Management Technique: One rail Right;Forwards Number of Stairs: 8    Exercises     PT Diagnosis:    PT Problem List:   PT Treatment Interventions:       PT Goals(Current goals can be found in the care plan section) Acute Rehab PT Goals Patient Stated Goal: To go home today  Visit Information  Last PT Received On: 08/29/12 Assistance Needed: +1 PT/OT Co-Evaluation/Treatment: Yes History of Present Illness: Jimmy Peterson is a 32 y.o. male with a long standing(since 2005) h/o  Back pain- no prior back sugeries and no trauma,  who presents with the above complaints. He states that today when he woke up the pain going down his leg(the side) was so severe that he was unable to bear weight on that leg- described as throbbing, esp. worse down his L. Hip and thigh and aggravated by walking. He tried taking aleve, but no relief. He denies any fall/trauma or recent heavy lifting. 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 Admits to some back pain on and off today, and intermittent tingling down his leg. States leg feels slightly weak, but that mostly the pain is making it difficult for him to walk. Denies fever, dysuria, n/v, and no URI or cold symptoms.  He also denies  He states he played sports in High school but did some wt lifting but does not recall any significant injuries. In the ED MRI of lumbar spine Multi focal facet joint inflammation in the lumbar spine.       Prior Functioning  Home Living Family/patient expects to be discharged to:: Private residence Living Arrangements: Parent Available Help at Discharge: Family;Available PRN/intermittently (mom works some  days) Type of Home: House Home Access: Stairs to enter [employer] of Steps: 10 Entrance Stairs-Rails: Right;Left Home Layout: Two level;Able to live on main level with bedroom/bathroom Home Equipment: None Prior Function Level of Independence: Independent Communication Communication: No difficulties Dominant Hand: Left    Cognition  Cognition Arousal/Alertness: Awake/alert Behavior During Therapy: WFL for tasks assessed/performed Overall Cognitive Status: Within Functional Limits for tasks assessed    Extremity/Trunk Assessment     Balance    End of Session PT - End of Session Equipment Utilized During Treatment: Gait belt Activity Tolerance: Patient tolerated treatment well Patient left: in chair Nurse Communication: Mobility status  GP     Bilaal Leib 08/29/2012, 2:17 PM  Jake Shark, PT DPT [phone]

## 2012-08-29 NOTE — Evaluation (Addendum)
Occupational Therapy Evaluation Patient Details Name: Jimmy Peterson MRN: [id number] DOB: [birth year] Today's Date: 08/29/2012 Time: 1093-2355 OT Time Calculation (min): 16 min  OT Assessment / Plan / Recommendation History of present illness Jimmy Peterson is a 32 y.o. male with a long standing(since 2005) h/o  Back pain- no prior back sugeries and no trauma,  who presents with the above complaints. He states that today when he woke up the pain going down his leg(the side) was so severe that he was unable to bear weight on that leg- described as throbbing, esp. worse down his L. Hip and thigh and aggravated by walking. He tried taking aleve, but no relief. He denies any fall/trauma or recent heavy lifting. 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 Admits to some back pain on and off today, and intermittent tingling down his leg. States leg feels slightly weak, but that mostly the pain is making it difficult for him to walk. Denies fever, dysuria, n/v, and no URI or cold symptoms. He also denies  He states he played sports in High school but did some wt lifting but does not recall any significant injuries. In the ED MRI of lumbar spine Multi focal facet joint inflammation in the lumbar spine.   Clinical Impression   Pt agreeable to work with therapy. Pt overall at Mod I level for ADLs. OT educated on tips for at home to prevent bending of back.     OT Assessment  Patient does not need any further OT services    Follow Up Recommendations  No OT follow up    Barriers to Discharge      Equipment Recommendations       Recommendations for Other Services    Frequency       Precautions / Restrictions Precautions Precautions: None   Pertinent Vitals/Pain Pain 2/10 at beginning of session. Increased activity.    ADL  Upper Body Dressing: Simulated;Independent Where Assessed - Upper Body Dressing: Supported sitting Lower Body Dressing:  Modified independent Where Assessed - Lower Body Dressing: Unsupported sit to stand Toilet Transfer: Simulated;Independent Toilet Transfer Method: Sit to Barista: Regular height toilet Tub/Shower Transfer: Simulated;Modified independent Tub/Shower Transfer Method: Science writer: Walk in shower Transfers/Ambulation Related to ADLs: Independent ADL Comments: Pt simulated shower transfer and at Mod I level.  OT educated to have seat in shower for LB bathing and a long handled sponge to prevent bending of back. Pt able to cross legs to don/doff socks while sitting in chair. Educated to bend from knees to pick up items and to try to keep back as straight as possible during daily activities.     OT Diagnosis:    OT Problem List:   OT Treatment Interventions:     OT Goals(Current goals can be found in the care plan section) Acute Rehab OT Goals Patient Stated Goal: To go home today  Visit Information  Last OT Received On: 08/29/12 Assistance Needed: +1 PT/OT Co-Evaluation/Treatment: Yes History of Present Illness: Jimmy Peterson is a 32 y.o. male with a long standing(since 2005) h/o  Back pain- no prior back sugeries and no trauma,  who presents with the above complaints. He states that today when he woke up the pain going down his leg(the side) was so severe that he was unable to bear weight on that leg- described as throbbing, esp. worse down his L. Hip and thigh and aggravated by walking. He  tried taking aleve, but no relief. He denies any fall/trauma or recent heavy lifting. 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 Admits to some back pain on and off today, and intermittent tingling down his leg. States leg feels slightly weak, but that mostly the pain is making it difficult for him to walk. Denies fever, dysuria, n/v, and no URI or cold symptoms. He also denies  He states he played sports in High  school but did some wt lifting but does not recall any significant injuries. In the ED MRI of lumbar spine Multi focal facet joint inflammation in the lumbar spine.       Prior Functioning     Home Living Family/patient expects to be discharged to:: Private residence Living Arrangements: Parent Available Help at Discharge: Family;Available PRN/intermittently (mom works some days) Type of Home: House Home Access: Stairs to enter [employer] of Steps: 10 Entrance Stairs-Rails: Right;Left Home Layout: Two level;Able to live on main level with bedroom/bathroom Home Equipment: None Prior Function Level of Independence: Independent Communication Communication: No difficulties Dominant Hand: Left         Vision/Perception     Cognition  Cognition Arousal/Alertness: Awake/alert Behavior During Therapy: WFL for tasks assessed/performed Overall Cognitive Status: Within Functional Limits for tasks assessed    Extremity/Trunk Assessment Upper Extremity Assessment Upper Extremity Assessment: Overall WFL for tasks assessed     Mobility Bed Mobility Bed Mobility: Supine to Sit Supine to Sit: 7: Independent Transfers Transfers: Sit to Stand;Stand to Sit Sit to Stand: 7: Independent;From toilet;From chair/3-in-1;From bed Stand to Sit: 7: Independent;To chair/3-in-1     Exercise     Balance     End of Session OT - End of Session Activity Tolerance: Patient tolerated treatment well Patient left: in chair;with family/visitor present  GO     Earlie Raveling OTR/L [phone] 08/29/2012, 4:16 PM

## 2012-08-29 NOTE — ED Provider Notes (Signed)
Medical screening examination/treatment/procedure(s) were conducted as a shared visit with non-physician practitioner(s) and myself.  I personally evaluated the patient during the encounter Medical screening examination/treatment/procedure(s) were conducted as a shared visit with non-physician practitioner(s) and myself.  I personally evaluated the patient during the encounter 32 yo man with back pain, which he has had on and off for years.  Prior negative MRI of LS spine in 2005.  He has recently had worsening of the back pain, to the point that today the pain in the right lumbar region is severe and radiates to the right leg, and he cannot walk due to the pain.  Therefore, MRI of lumbar spine ordered, which showed no herniated discs, but did show facet inflammation and a possible lesion in a T11 pedicle.  This MRI was reviewed by Coletta Memos, M.D., neurosurgeon, who felt that no neurosurgical intervention was needed, but that medical workup of the inflammatory changes in his spine, which could represent infection, should be worked up.  Admission to unassigned medicine recommended.   Carleene Cooper III, MD 08/29/12 [phone]

## 2012-08-29 NOTE — Discharge Planning (Signed)
Patient discharged home in stable condition. Verbalizes understanding of all discharge instructions, including home medications and follow up appointments. 

## 2012-08-29 NOTE — ED Provider Notes (Signed)
Medical screening examination/treatment/procedure(s) were conducted as a shared visit with non-physician practitioner(s) and myself.  I personally evaluated the patient during the encounter 32 yo man with back pain, which he has had on and off for years.  Prior negative MRI of LS spine in 2005.  He has recently had worsening of the back pain, to the point that today the pain in the right lumbar region is severe and radiates to the right leg, and he cannot walk due to the pain.  Therefore, MRI of lumbar spine ordered, which showed no herniated discs, but did show facet inflammation and a possible lesion in a T11 pedicle.  This MRI was reviewed by Coletta Memos, M.D., neurosurgeon, who felt that no neurosurgical intervention was needed, but that medical workup of the inflammatory changes in his spine, which could represent infection, should be worked up.  Admission to unassigned medicine recommended.  Carleene Cooper III, MD 08/29/12 [phone]

## 2012-08-31 LAB — HLA-B27 ANTIGEN: DNA Result:: DETECTED — AB

## 2012-09-03 LAB — CULTURE, BLOOD (ROUTINE X 2)
Culture: NO GROWTH
Culture: NO GROWTH

## 2012-09-20 ENCOUNTER — Encounter: Payer: Self-pay | Admitting: Internal Medicine

## 2012-09-20 ENCOUNTER — Ambulatory Visit: Payer: No Typology Code available for payment source | Attending: Internal Medicine | Admitting: Internal Medicine

## 2012-09-20 VITALS — BP 102/66 | HR 83 | Temp 99.0°F | Resp 16 | Ht 67.0 in | Wt 173.0 lb

## 2012-09-20 DIAGNOSIS — R937 Abnormal findings on diagnostic imaging of other parts of musculoskeletal system: Secondary | ICD-10-CM

## 2012-09-20 DIAGNOSIS — M549 Dorsalgia, unspecified: Secondary | ICD-10-CM | POA: Insufficient documentation

## 2012-09-20 MED ORDER — OXYCODONE HCL 5 MG PO TABS: 5.0000 mg | ORAL_TABLET | Freq: Four times a day (QID) | ORAL | Status: DC | PRN

## 2012-09-20 MED ORDER — METHOCARBAMOL 500 MG PO TABS: 500.0000 mg | ORAL_TABLET | Freq: Three times a day (TID) | ORAL | Status: DC | PRN

## 2012-09-20 MED ORDER — NAPROXEN 500 MG PO TABS: 500.0000 mg | ORAL_TABLET | Freq: Two times a day (BID) | ORAL | Status: DC

## 2012-09-20 MED ORDER — OMEPRAZOLE 40 MG PO CPDR: 40.0000 mg | DELAYED_RELEASE_CAPSULE | Freq: Every day | ORAL | Status: DC

## 2012-09-20 NOTE — Progress Notes (Signed)
Patient Demographics  Jimmy Peterson, is a 32 y.o. male  ZOX:[id number]  WJX:[id number]  DOB - Dec 18, 1980  Chief Complaint  Patient presents with  . Follow-up    [facility]U BACK PAIN        Subjective:   Jimmy Peterson today is here to establish primary care. Patient has had one episode of back pain a few years back. He was admitted to the hospital recently for worsening back pain and inability to walk. MRI of the lumbar spine showed inflammation and numerous facet joints in the back. It was thought that this was not related to any sort of infection, he was observed off antibiotics. He was given high-dose naproxen with good relief. He is here for followup visit, back pain is currently well controlled with naproxen. Currently patient has no other complaints. He does not have any history of urinary incontinence, fever, fecal incontinence or any weakness. He is ambulating without any difficulty. Gait is stable. Patient has also has No headache, No chest pain, No abdominal pain,No Nausea, No new weakness tingling or numbness, No Cough or SOB.   Objective:    Filed Vitals:   09/20/12 1646  BP: 102/66  Pulse: 83  Temp: 99 F (37.2 C)  TempSrc: Oral  Resp: 16  Height: 5\' 7"  (1.702 m)  Weight: 173 lb (78.472 kg)  SpO2: 98%     ALLERGIES:   Allergies  Allergen Reactions  . Aspirin Hives    PAST MEDICAL HISTORY: 1 prior episode of back pain in the past.  PAST SURGICAL HISTORY: History reviewed. No pertinent past surgical history.  FAMILY HISTORY: Family History  Problem Relation Age of Onset  . Diabetes Mother   . Heart disease Mother     MEDICATIONS AT HOME: Prior to Admission medications   Medication Sig Start Date End Date Taking? Authorizing Provider  methocarbamol (ROBAXIN) 500 MG tablet Take 1 tablet (500 mg total) by mouth 3 (three) times daily as needed (spams). 09/20/12  Yes Akacia Boltz Levora Dredge, MD  naproxen (NAPROSYN) 500 MG tablet Take 1 tablet (500 mg  total) by mouth 2 (two) times daily with a meal. 09/20/12  Yes Dazani Norby Levora Dredge, MD  omeprazole (PRILOSEC) 40 MG capsule Take 1 capsule (40 mg total) by mouth daily. 09/20/12   Eddi Hymes Levora Dredge, MD  oxyCODONE (OXY IR/ROXICODONE) 5 MG immediate release tablet Take 1 tablet (5 mg total) by mouth every 6 (six) hours as needed. 09/20/12   Socorro Kanitz Levora Dredge, MD    SOCIAL HISTORY:   reports that he has been smoking Cigarettes.  He has a 3.25 pack-year smoking history. He does not have any smokeless tobacco history on file. He reports that  drinks alcohol. He reports that he uses illicit drugs (Marijuana) about once per week.  REVIEW OF SYSTEMS:  Constitutional:   No   Fevers, chills, fatigue.  HEENT:    No headaches, Sore throat,   Cardio-vascular: No chest pain,  Orthopnea, swelling in lower extremities, anasarca, palpitations  GI:  No abdominal pain, nausea, vomiting, diarrhea  Resp: No shortness of breath,  No coughing up of blood.No cough.No wheezing.  Skin:  no rash or lesions.  GU:  no dysuria, change in color of urine, no urgency or frequency.  No flank pain.  Musculoskeletal: No joint pain or swelling.  No decreased range of motion.  No back pain.  Psych: No change in mood or affect. No depression or anxiety.  No memory loss.   Exam  General appearance :Awake,  alert, not in any distress. Speech Clear. Not toxic Looking HEENT: Atraumatic and Normocephalic, pupils equally reactive to light and accomodation Neck: supple, no JVD. No cervical lymphadenopathy.  Chest:Good air entry bilaterally, no added sounds  CVS: S1 S2 regular, no murmurs.  Abdomen: Bowel sounds present, Non tender and not distended with no gaurding, rigidity or rebound. Extremities: B/L Lower Ext shows no edema, both legs are warm to touch Neurology: Awake alert, and oriented X 3, CN II-XII intact, Non focal Skin:No Rash Wounds:N/A    Data Review   CBC No results found for this basename: WBC,  HGB, HCT, PLT, MCV, MCH, MCHC, RDW, NEUTRABS, LYMPHSABS, MONOABS, EOSABS, BASOSABS, BANDABS, BANDSABD,  in the last 168 hours  Chemistries   No results found for this basename: NA, K, CL, CO2, GLUCOSE, BUN, CREATININE, GFRCGP, CALCIUM, MG, AST, ALT, ALKPHOS, BILITOT,  in the last 168 hours ------------------------------------------------------------------------------------------------------------------ No results found for this basename: HGBA1C,  in the last 72 hours ------------------------------------------------------------------------------------------------------------------ No results found for this basename: CHOL, HDL, LDLCALC, TRIG, CHOLHDL, LDLDIRECT,  in the last 72 hours ------------------------------------------------------------------------------------------------------------------ No results found for this basename: TSH, T4TOTAL, FREET3, T3FREE, THYROIDAB,  in the last 72 hours ------------------------------------------------------------------------------------------------------------------ No results found for this basename: VITAMINB12, FOLATE, FERRITIN, TIBC, IRON, RETICCTPCT,  in the last 72 hours  Coagulation profile  No results found for this basename: INR, PROTIME,  in the last 168 hours    Assessment & Plan   Back pain- with a lot of inflamed facet joints in his MRI lumbar spine-with elevated inflammation markers-both ESR and CRP elevated. HLA-B27 also positive.  Continue with the naproxen, have refilled oxycodone 30 tablets today  Spoken with appointment coordinator-Nora-she will refer patient to rheumatology, and call him with an appointment. Patient has been aware of this and is agreeable.  Mild normocytic anemia - Monitor, repeat CBC in the next 3-6 months   The patient was given clear instructions to go to ER or return to medical center if symptoms don't improve, worsen or new problems develop. The patient verbalized understanding. The patient was told to call  to get lab results if they haven't heard anything in the next week.    followup in one month.

## 2012-09-20 NOTE — Progress Notes (Signed)
PT HERE TO ESTABLISH CARE POST HFU LOWER BACK PAIN WITH WEAKNESS MRI/XRAYS NEG IN [place] RAN OUT OF PAIN NARCOTICS 4/10 PAIN

## 2012-10-19 ENCOUNTER — Ambulatory Visit: Payer: No Typology Code available for payment source

## 2012-10-30 ENCOUNTER — Telehealth: Payer: Self-pay | Admitting: Internal Medicine

## 2012-10-30 NOTE — Telephone Encounter (Signed)
Pt says he has not received info packet for referral to Rheumatologist. Please f/u with pt.

## 2012-10-31 NOTE — Telephone Encounter (Signed)
Pt is aware of his appointment with Dr Chrissie Noa truslow and he is going to call them to ask for the new patient package that he haven't receive .

## 2012-11-07 ENCOUNTER — Other Ambulatory Visit: Payer: Self-pay | Admitting: Rheumatology

## 2012-11-07 ENCOUNTER — Ambulatory Visit
Admission: RE | Admit: 2012-11-07 | Discharge: 2012-11-07 | Disposition: A | Discharge status: Home / Self Care | Payer: No Typology Code available for payment source | Source: Ambulatory Visit | Attending: Rheumatology | Admitting: Rheumatology

## 2012-11-07 DIAGNOSIS — M25552 Pain in left hip: Secondary | ICD-10-CM

## 2012-11-07 DIAGNOSIS — M25551 Pain in right hip: Secondary | ICD-10-CM

## 2012-11-07 DIAGNOSIS — M545 Low back pain: Secondary | ICD-10-CM

## 2012-11-23 ENCOUNTER — Telehealth: Payer: Self-pay | Admitting: Emergency Medicine

## 2012-11-23 NOTE — Telephone Encounter (Signed)
Pt requesting pain narcotic refill. Informed pt of policy. States hewill schedule appt for f/u.

## 2012-12-19 ENCOUNTER — Encounter (HOSPITAL_COMMUNITY): Payer: Self-pay | Admitting: Emergency Medicine

## 2012-12-19 ENCOUNTER — Emergency Department (HOSPITAL_COMMUNITY)
Admission: EM | Admit: 2012-12-19 | Discharge: 2012-12-19 | Disposition: A | Discharge status: Home / Self Care | Payer: No Typology Code available for payment source | Attending: Emergency Medicine | Admitting: Emergency Medicine

## 2012-12-19 DIAGNOSIS — L02215 Cutaneous abscess of perineum: Secondary | ICD-10-CM

## 2012-12-19 DIAGNOSIS — F17200 Nicotine dependence, unspecified, uncomplicated: Secondary | ICD-10-CM | POA: Insufficient documentation

## 2012-12-19 DIAGNOSIS — Z888 Allergy status to other drugs, medicaments and biological substances status: Secondary | ICD-10-CM | POA: Insufficient documentation

## 2012-12-19 DIAGNOSIS — K612 Anorectal abscess: Secondary | ICD-10-CM | POA: Insufficient documentation

## 2012-12-19 DIAGNOSIS — M549 Dorsalgia, unspecified: Secondary | ICD-10-CM | POA: Insufficient documentation

## 2012-12-19 MED ORDER — POLYETHYLENE GLYCOL 3350 17 G PO PACK: 17.0000 g | PACK | Freq: Every day | ORAL | Status: DC

## 2012-12-19 MED ORDER — AMOXICILLIN-POT CLAVULANATE 875-125 MG PO TABS: 1.0000 | ORAL_TABLET | Freq: Two times a day (BID) | ORAL | Status: DC

## 2012-12-19 MED ORDER — OXYCODONE-ACETAMINOPHEN 5-325 MG PO TABS: 1.0000 | ORAL_TABLET | Freq: Four times a day (QID) | ORAL | Status: DC | PRN

## 2012-12-19 NOTE — ED Provider Notes (Signed)
CSN: [id number]     Arrival date & time 12/19/12  0725 History   First MD Initiated Contact with Patient 12/19/12 0745     Chief Complaint  Patient presents with  . Rectal Pain   (Consider location/radiation/quality/duration/timing/severity/associated sxs/prior Treatment) The history is provided by the patient.   patient presents with pain in his rectal area. His been going for a few days. No drainage. No fevers. He has had some pain with bowel movements. He has not had episodes like this before. He's had recent back pain has been on steroids. He is finishing up a taper now. Patient states that there is swelling there that his girlfriend thought was an abscess. He states that it hurts to sit down.  History reviewed. No pertinent past medical history. No past surgical history on file. Family History  Problem Relation Age of Onset  . Diabetes Mother   . Heart disease Mother    History  Substance Use Topics  . Smoking status: Current Every Day Smoker -- 0.25 packs/day for 13 years    Types: Cigarettes  . Smokeless tobacco: Not on file  . Alcohol Use: 0.0 oz/week    0 Cans of beer, 0 Shots of liquor per week    Review of Systems  Constitutional: Negative for fever and appetite change.  Gastrointestinal: Positive for abdominal pain and rectal pain. Negative for nausea, vomiting, diarrhea, constipation, blood in stool and anal bleeding.  Genitourinary: Negative for penile swelling, scrotal swelling and testicular pain.  Skin: Positive for wound.    Allergies  Aspirin  Home Medications   Current Outpatient Rx  Name  Route  Sig  Dispense  Refill  . naproxen (NAPROSYN) 500 MG tablet   Oral   Take 1 tablet (500 mg total) by mouth 2 (two) times daily with a meal.   60 tablet   0   . amoxicillin-clavulanate (AUGMENTIN) 875-125 MG per tablet   Oral   Take 1 tablet by mouth 2 (two) times daily.   10 tablet   0   . oxyCODONE-acetaminophen (PERCOCET/ROXICET) 5-325 MG per  tablet   Oral   Take 1-2 tablets by mouth every 6 (six) hours as needed for severe pain.   20 tablet   0   . polyethylene glycol (MIRALAX / GLYCOLAX) packet   Oral   Take 17 g by mouth daily.   14 each   0    BP 130/95  Pulse 87  Temp(Src) 98.6 F (37 C) (Oral)  Resp 20  SpO2 100% Physical Exam  Constitutional: He is oriented to person, place, and time. He appears well-developed.  HENT:  Head: Normocephalic.  Cardiovascular: Normal rate and regular rhythm.   Pulmonary/Chest: Effort normal and breath sounds normal.  Abdominal: Soft. Bowel sounds are normal. There is no tenderness.  Genitourinary:  Swollen fluctuant area anterior and to the left of his anus. Approximately 8 cm long. No drainage.  Neurological: He is alert and oriented to person, place, and time.    ED Course  INCISION AND DRAINAGE Date/Time: 12/19/2012 9:18 AM Performed by: Benjiman Core R. Authorized by: Billee Cashing Consent: written consent not obtained. Risks and benefits: risks, benefits and alternatives were discussed Consent given by: patient Patient understanding: patient states understanding of the procedure being performed Patient consent: the patient's understanding of the procedure matches consent given Procedure consent: procedure consent matches procedure scheduled Relevant documents: relevant documents present and verified Test results: test results available and properly labeled Site marked: the  operative site was marked Imaging studies: imaging studies available Required items: required blood products, implants, devices, and special equipment available Patient identity confirmed: verbally with patient, arm band and provided demographic data Time out: Immediately prior to procedure a "time out" was called to verify the correct patient, procedure, equipment, support staff and site/side marked as required. Type: abscess Body area: anogenital Location details:  perianal Anesthesia: local infiltration Local anesthetic: lidocaine 2% without epinephrine Anesthetic total: 10 ml Patient sedated: no Risk factor: underlying major vessel and underlying major nerve Scalpel size: 11 Incision type: single straight Complexity: complex Drainage: purulent Drainage amount: copious Wound treatment: wound left open Packing material: none Patient tolerance: Patient tolerated the procedure well with no immediate complications.   (including critical care time) Labs Review Labs Reviewed - No data to display Imaging Review No results found.  EKG Interpretation   None       MDM   1. Perineal abscess    Patient with perianal abscess with some surrounding cellulitis. Drained with large amount of purulent drainage. Will give antibiotics since is on low-dose steroids at this time. We'll have followup general surgery. No drain placed into proximity to the anus at this time. Patient was given supplies to help facilitate drainage at home. He will do sitz baths 3 times a day. He will return for increasing pain or fevers.      Juliet Rude. Rubin Payor, MD 12/19/12 [phone]

## 2012-12-19 NOTE — ED Notes (Signed)
rectal pain x 2 days  Has not noticed bleeding just swelling and it is raw feeling ? Whether it is meds he is on for pain

## 2013-02-06 ENCOUNTER — Encounter (HOSPITAL_COMMUNITY): Payer: Self-pay | Admitting: Emergency Medicine

## 2013-02-06 DIAGNOSIS — Z791 Long term (current) use of non-steroidal anti-inflammatories (NSAID): Secondary | ICD-10-CM | POA: Insufficient documentation

## 2013-02-06 DIAGNOSIS — F17200 Nicotine dependence, unspecified, uncomplicated: Secondary | ICD-10-CM | POA: Insufficient documentation

## 2013-02-06 DIAGNOSIS — K6289 Other specified diseases of anus and rectum: Secondary | ICD-10-CM | POA: Insufficient documentation

## 2013-02-06 DIAGNOSIS — K612 Anorectal abscess: Secondary | ICD-10-CM | POA: Insufficient documentation

## 2013-02-06 NOTE — ED Notes (Signed)
Pt states a recurrent boil that is on his back. Pt last time had the have the area I & D. Pt states this time he has had the boil for the past 2 days.

## 2013-02-07 ENCOUNTER — Emergency Department (HOSPITAL_COMMUNITY)
Admission: EM | Admit: 2013-02-07 | Discharge: 2013-02-07 | Disposition: A | Discharge status: Home / Self Care | Payer: No Typology Code available for payment source | Attending: Emergency Medicine | Admitting: Emergency Medicine

## 2013-02-07 DIAGNOSIS — K610 Anal abscess: Secondary | ICD-10-CM

## 2013-02-07 MED ORDER — OXYCODONE-ACETAMINOPHEN 5-325 MG PO TABS: 1.0000 | ORAL_TABLET | Freq: Four times a day (QID) | ORAL | Status: DC | PRN

## 2013-02-07 NOTE — ED Notes (Addendum)
Patient presents with c/o boil to his left buttock.  States Monday it started out like a small bump and today it is larger and is very painful.  Has had other boils in the past.

## 2013-02-07 NOTE — Discharge Instructions (Signed)
Return in 48 hours for a recheck and packing removal. Take ibuprofen for pain control and percocet as needed for severe pain. Change your dressing tomorrow evening. Do not replace packing if it falls out prior to recheck. Return to the ED if symptoms worsen such as if you develop fever, abdominal pain, pus from your anus, pain when having a bowel movement or scrotal redness/swelling.  Abscess An abscess (boil or furuncle) is an infected area on or under the skin. This area is filled with yellowish-white fluid (pus) and other material (debris). HOME CARE   Only take medicines as told by your doctor.  If you were given antibiotic medicine, take it as directed. Finish the medicine even if you start to feel better.  If gauze is used, follow your doctor's directions for changing the gauze.  To avoid spreading the infection:  Keep your abscess covered with a bandage.  Wash your hands well.  Do not share personal care items, towels, or whirlpools with others.  Avoid skin contact with others.  Keep your skin and clothes clean around the abscess.  Keep all doctor visits as told. GET HELP RIGHT AWAY IF:   You have more pain, puffiness (swelling), or redness in the wound site.  You have more fluid or blood coming from the wound site.  You have muscle aches, chills, or you feel sick.  You have a fever. MAKE SURE YOU:   Understand these instructions.  Will watch your condition.  Will get help right away if you are not doing well or get worse. Document Released: 07/07/2007 Document Revised: 07/20/2011 Document Reviewed: 04/02/2011 [facility] Patient Information 2014 [place], [place].

## 2013-02-07 NOTE — ED Provider Notes (Signed)
CSN: [id number]     Arrival date & time 02/06/13  2311 History   First MD Initiated Contact with Patient 02/07/13 0049     Chief Complaint  Patient presents with  . Recurrent Skin Infections   (Consider location/radiation/quality/duration/timing/severity/associated sxs/prior Treatment) HPI Comments: Patient denies a hx of MRSA  Patient is a 33 y.o. male presenting with abscess. The history is provided by the patient. No language interpreter was used.  Abscess Location:  Ano-genital Ano-genital abscess location:  L buttock Abscess quality: fluctuance, induration, painful and redness   Abscess quality: not draining and not weeping   Red streaking: no   Duration:  2 days Progression:  Worsening Pain details:    Quality:  Throbbing   Severity:  Moderate   Duration:  2 days   Timing:  Constant Chronicity:  New Context: not diabetes and not immunosuppression   Context comment:  Patient not on chronic steroids Relieved by:  Nothing Exacerbated by: pressure to area. Ineffective treatments:  Warm compresses and warm water soaks Associated symptoms: no fatigue, no fever, no nausea and no vomiting   Risk factors: prior abscess   Risk factors: no hx of MRSA     History reviewed. No pertinent past medical history. History reviewed. No pertinent past surgical history. Family History  Problem Relation Age of Onset  . Diabetes Mother   . Heart disease Mother    History  Substance Use Topics  . Smoking status: Current Every Day Smoker -- 0.25 packs/day for 13 years    Types: Cigarettes  . Smokeless tobacco: Not on file  . Alcohol Use: 0.0 oz/week    0 Cans of beer, 0 Shots of liquor per week    Review of Systems  Constitutional: Negative for fever and fatigue.  Gastrointestinal: Positive for rectal pain. Negative for nausea and vomiting.  Skin: Positive for color change.  All other systems reviewed and are negative.    Allergies  Aspirin  Home Medications   Current  Outpatient Rx  Name  Route  Sig  Dispense  Refill  . naproxen sodium (ANAPROX) 220 MG tablet   Oral   Take 220 mg by mouth 2 (two) times daily with a meal.         . oxyCODONE-acetaminophen (PERCOCET/ROXICET) 5-325 MG per tablet   Oral   Take 1-2 tablets by mouth every 6 (six) hours as needed for severe pain.   15 tablet   0    BP 117/77  Pulse 84  Temp(Src) 98.8 F (37.1 C) (Oral)  Resp 16  Wt 165 lb (74.844 kg)  SpO2 98%  Physical Exam  Nursing note and vitals reviewed. Constitutional: He is oriented to person, place, and time. He appears well-developed and well-nourished. No distress.  HENT:  Head: Normocephalic and atraumatic.  Eyes: Conjunctivae and EOM are normal. No scleral icterus.  Neck: Normal range of motion.  Pulmonary/Chest: Effort normal. No respiratory distress.  Genitourinary: Rectum normal and penis normal. Rectal exam shows no external hemorrhoid, no internal hemorrhoid, no tenderness and anal tone normal. Right testis shows no mass, no swelling and no tenderness. Right testis is descended. Left testis shows no mass, no swelling and no tenderness. Left testis is descended.     Abscess as indicated. 1.5cmx4.5cm with central fluctuance. No active drainage, purulent d/c, or weeping. No induration around rectum or in rectal vault on chaperoned GU exam. No TTP out of proportion to rectal exam. No scrotal swelling, tenderness, or erythema. No red linear streaking  appreciated.  Musculoskeletal: Normal range of motion.  Neurological: He is alert and oriented to person, place, and time.  Skin: Skin is warm and dry. No rash noted. He is not diaphoretic. No erythema. No pallor.  Psychiatric: He has a normal mood and affect. His behavior is normal.    ED Course  Procedures (including critical care time) Labs Review Labs Reviewed - No data to display Imaging Review No results found.  EKG Interpretation   None       INCISION AND DRAINAGE Performed by:  Antony Madura Consent: Verbal consent obtained. Risks and benefits: risks, benefits and alternatives were discussed Type: abscess  Body area: L perianal/gluteal region  Anesthesia: local infiltration  Incision was made with a scalpel.  Local anesthetic: lidocaine 2% with epinephrine  Anesthetic total: 6 ml  Complexity: complex Blunt dissection to break up loculations  Drainage: purulent  Drainage amount: copious; approximately 8cc  Packing material: 1/4 in iodoform gauze  Patient tolerance: Patient tolerated the procedure well with no immediate complications.    MDM   1. Perianal abscess    Uncomplicated perianal abscess. No scrotal tenderness, swelling, or redness on chaperoned GU exam. No tenderness to palpation on rectal exam. No induration around rectum or in rectal vault. Abscess incised and drained at bedside. Patient tolerated well. He is hemodynamically stable, afebrile, and appropriate for discharge with return in 48 hours for packing removal. Return precautions discussed with the patient who verbalizes comfort and understanding with this discharge plan with no unaddressed concerns.   Filed Vitals:   02/06/13 2315 02/07/13 0231  BP: 117/77 130/84  Pulse: 84 83  Temp: 98.8 F (37.1 C)   TempSrc: Oral   Resp: 16 14  Weight: 165 lb (74.844 kg)   SpO2: 98% 95%      Antony Madura, PA-C 02/07/13 0236

## 2013-02-07 NOTE — ED Provider Notes (Signed)
Medical screening examination/treatment/procedure(s) were performed by non-physician practitioner and as supervising physician I was immediately available for consultation/collaboration.  EKG Interpretation   None         Glynn Octave, MD 02/07/13 0430

## 2016-05-24 ENCOUNTER — Other Ambulatory Visit: Payer: Self-pay | Admitting: Rheumatology

## 2016-05-24 DIAGNOSIS — M47818 Spondylosis without myelopathy or radiculopathy, sacral and sacrococcygeal region: Secondary | ICD-10-CM

## 2016-05-24 DIAGNOSIS — M47819 Spondylosis without myelopathy or radiculopathy, site unspecified: Secondary | ICD-10-CM

## 2016-05-24 DIAGNOSIS — M47812 Spondylosis without myelopathy or radiculopathy, cervical region: Secondary | ICD-10-CM

## 2016-05-24 DIAGNOSIS — M47814 Spondylosis without myelopathy or radiculopathy, thoracic region: Secondary | ICD-10-CM

## 2016-06-10 ENCOUNTER — Other Ambulatory Visit: Payer: No Typology Code available for payment source

## 2016-06-17 ENCOUNTER — Ambulatory Visit
Admission: RE | Admit: 2016-06-17 | Discharge: 2016-06-17 | Disposition: A | Discharge status: Home / Self Care | Payer: No Typology Code available for payment source | Source: Ambulatory Visit | Attending: Rheumatology | Admitting: Rheumatology

## 2016-06-17 DIAGNOSIS — M47818 Spondylosis without myelopathy or radiculopathy, sacral and sacrococcygeal region: Secondary | ICD-10-CM

## 2016-06-17 DIAGNOSIS — M47814 Spondylosis without myelopathy or radiculopathy, thoracic region: Secondary | ICD-10-CM

## 2016-06-17 DIAGNOSIS — M47819 Spondylosis without myelopathy or radiculopathy, site unspecified: Secondary | ICD-10-CM

## 2016-06-17 DIAGNOSIS — M47812 Spondylosis without myelopathy or radiculopathy, cervical region: Secondary | ICD-10-CM

## 2016-09-16 ENCOUNTER — Other Ambulatory Visit: Payer: Self-pay | Admitting: Rheumatology

## 2016-09-16 DIAGNOSIS — M459 Ankylosing spondylitis of unspecified sites in spine: Secondary | ICD-10-CM

## 2016-09-29 ENCOUNTER — Ambulatory Visit
Admission: RE | Admit: 2016-09-29 | Discharge: 2016-09-29 | Disposition: A | Discharge status: Home / Self Care | Payer: Self-pay | Source: Ambulatory Visit | Attending: Rheumatology | Admitting: Rheumatology

## 2016-09-29 ENCOUNTER — Ambulatory Visit
Admission: RE | Admit: 2016-09-29 | Discharge: 2016-09-29 | Disposition: A | Discharge status: Home / Self Care | Payer: No Typology Code available for payment source | Source: Ambulatory Visit | Attending: Rheumatology | Admitting: Rheumatology

## 2016-09-29 DIAGNOSIS — M459 Ankylosing spondylitis of unspecified sites in spine: Secondary | ICD-10-CM

## 2017-04-27 ENCOUNTER — Other Ambulatory Visit: Payer: Self-pay | Admitting: Rheumatology

## 2017-04-27 DIAGNOSIS — M069 Rheumatoid arthritis, unspecified: Secondary | ICD-10-CM

## 2017-05-21 ENCOUNTER — Other Ambulatory Visit: Payer: Self-pay | Admitting: Rheumatology

## 2017-05-21 DIAGNOSIS — M069 Rheumatoid arthritis, unspecified: Secondary | ICD-10-CM

## 2017-05-23 ENCOUNTER — Ambulatory Visit
Admission: RE | Admit: 2017-05-23 | Discharge: 2017-05-23 | Disposition: A | Discharge status: Home / Self Care | Payer: No Typology Code available for payment source | Source: Ambulatory Visit | Attending: Rheumatology | Admitting: Rheumatology

## 2017-05-23 ENCOUNTER — Inpatient Hospital Stay: Admission: RE | Admit: 2017-05-23 | Payer: No Typology Code available for payment source | Source: Ambulatory Visit

## 2017-05-23 ENCOUNTER — Other Ambulatory Visit: Payer: No Typology Code available for payment source

## 2017-05-23 DIAGNOSIS — M069 Rheumatoid arthritis, unspecified: Secondary | ICD-10-CM

## 2017-10-15 ENCOUNTER — Other Ambulatory Visit: Payer: Self-pay

## 2017-10-15 ENCOUNTER — Emergency Department (HOSPITAL_COMMUNITY): Payer: No Typology Code available for payment source

## 2017-10-15 ENCOUNTER — Encounter (HOSPITAL_COMMUNITY): Payer: Self-pay

## 2017-10-15 ENCOUNTER — Emergency Department (HOSPITAL_COMMUNITY)
Admission: EM | Admit: 2017-10-15 | Discharge: 2017-10-15 | Disposition: A | Discharge status: Home / Self Care | Payer: No Typology Code available for payment source | Attending: Emergency Medicine | Admitting: Emergency Medicine

## 2017-10-15 DIAGNOSIS — Z79899 Other long term (current) drug therapy: Secondary | ICD-10-CM | POA: Insufficient documentation

## 2017-10-15 DIAGNOSIS — S161XXA Strain of muscle, fascia and tendon at neck level, initial encounter: Secondary | ICD-10-CM

## 2017-10-15 DIAGNOSIS — S199XXA Unspecified injury of neck, initial encounter: Secondary | ICD-10-CM | POA: Diagnosis present

## 2017-10-15 DIAGNOSIS — Y929 Unspecified place or not applicable: Secondary | ICD-10-CM | POA: Diagnosis not present

## 2017-10-15 DIAGNOSIS — Y939 Activity, unspecified: Secondary | ICD-10-CM | POA: Insufficient documentation

## 2017-10-15 DIAGNOSIS — F17210 Nicotine dependence, cigarettes, uncomplicated: Secondary | ICD-10-CM | POA: Diagnosis not present

## 2017-10-15 DIAGNOSIS — Y999 Unspecified external cause status: Secondary | ICD-10-CM | POA: Diagnosis not present

## 2017-10-15 MED ORDER — CYCLOBENZAPRINE HCL 10 MG PO TABS
10.0000 mg | ORAL_TABLET | Freq: Once | ORAL | Status: AC
  Administered 2017-10-15: 10 mg via ORAL
  Filled 2017-10-15: qty 1

## 2017-10-15 MED ORDER — METHOCARBAMOL 500 MG PO TABS: 500.0000 mg | ORAL_TABLET | Freq: Two times a day (BID) | ORAL | 0 refills | Status: DC
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
## 2017-10-15 NOTE — ED Notes (Signed)
Patient transported to X-ray 
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
## 2017-10-15 NOTE — Discharge Instructions (Signed)
Continue to take the Aleve. Do not take the muscle relaxer when driving as it can make you sleepy. Follow up with your doctor or return here for worsening symptoms.

## 2018-05-01 ENCOUNTER — Other Ambulatory Visit: Payer: Self-pay | Admitting: Rheumatology

## 2018-05-05 ENCOUNTER — Other Ambulatory Visit: Payer: Self-pay | Admitting: Rheumatology

## 2018-05-05 DIAGNOSIS — M456 Ankylosing spondylitis lumbar region: Secondary | ICD-10-CM

## 2018-05-05 DIAGNOSIS — M458 Ankylosing spondylitis sacral and sacrococcygeal region: Secondary | ICD-10-CM

## 2018-05-05 DIAGNOSIS — M454 Ankylosing spondylitis of thoracic region: Secondary | ICD-10-CM

## 2018-05-05 DIAGNOSIS — M452 Ankylosing spondylitis of cervical region: Secondary | ICD-10-CM

## 2018-05-23 ENCOUNTER — Other Ambulatory Visit: Payer: Self-pay

## 2018-05-23 ENCOUNTER — Ambulatory Visit
Admission: RE | Admit: 2018-05-23 | Discharge: 2018-05-23 | Disposition: A | Discharge status: Home / Self Care | Payer: No Typology Code available for payment source | Source: Ambulatory Visit | Attending: Rheumatology | Admitting: Rheumatology

## 2018-05-23 ENCOUNTER — Inpatient Hospital Stay: Admission: RE | Admit: 2018-05-23 | Payer: No Typology Code available for payment source | Source: Ambulatory Visit

## 2018-05-23 DIAGNOSIS — M452 Ankylosing spondylitis of cervical region: Secondary | ICD-10-CM

## 2018-05-23 DIAGNOSIS — M454 Ankylosing spondylitis of thoracic region: Secondary | ICD-10-CM

## 2018-05-23 DIAGNOSIS — M458 Ankylosing spondylitis sacral and sacrococcygeal region: Secondary | ICD-10-CM

## 2018-05-23 DIAGNOSIS — M456 Ankylosing spondylitis lumbar region: Secondary | ICD-10-CM

## 2018-06-22 ENCOUNTER — Other Ambulatory Visit: Payer: No Typology Code available for payment source

## 2018-06-29 ENCOUNTER — Other Ambulatory Visit: Payer: No Typology Code available for payment source

## 2019-02-19 ENCOUNTER — Other Ambulatory Visit: Payer: Self-pay

## 2019-02-20 ENCOUNTER — Ambulatory Visit: Payer: No Typology Code available for payment source | Admitting: Family Medicine

## 2019-02-22 ENCOUNTER — Other Ambulatory Visit: Payer: Self-pay

## 2019-02-23 ENCOUNTER — Ambulatory Visit (INDEPENDENT_AMBULATORY_CARE_PROVIDER_SITE_OTHER): Payer: 59 | Admitting: Family Medicine

## 2019-02-23 ENCOUNTER — Encounter: Payer: Self-pay | Admitting: Family Medicine

## 2019-02-23 VITALS — BP 132/72 | HR 92 | Temp 97.1°F | Ht 67.0 in | Wt 143.4 lb

## 2019-02-23 DIAGNOSIS — G8929 Other chronic pain: Secondary | ICD-10-CM

## 2019-02-23 DIAGNOSIS — M450 Ankylosing spondylitis of multiple sites in spine: Secondary | ICD-10-CM | POA: Diagnosis not present

## 2019-02-23 DIAGNOSIS — M25551 Pain in right hip: Secondary | ICD-10-CM | POA: Diagnosis not present

## 2019-02-23 MED ORDER — METHOCARBAMOL 500 MG PO TABS: 500.0000 mg | ORAL_TABLET | Freq: Three times a day (TID) | ORAL | 0 refills | Status: AC | PRN

## 2019-02-23 MED ORDER — PREDNISONE 10 MG (48) PO TBPK: ORAL_TABLET | ORAL | 0 refills | Status: DC

## 2019-02-23 MED ORDER — METHOCARBAMOL 500 MG PO TABS: 500.0000 mg | ORAL_TABLET | Freq: Three times a day (TID) | ORAL | 0 refills | Status: DC | PRN

## 2019-02-23 NOTE — Progress Notes (Signed)
New Patient Office Visit  Subjective:  Patient ID: Jimmy Peterson, male    DOB: 1980/05/27  Age: 39 y.o. MRN: [id number]  CC:  Chief Complaint  Patient presents with  . Pain    new patient c/o of right leg pain becoming worse.    HPI Jimmy Peterson presents for establishment of care.  Here today for evaluation and treatment of right leg pain.  This has been an ongoing issue.  Significant past medical history of ankylosing spondylitis.  Chart review indicates that it has involved his thoracic and lumbar spine areas.  SI joints are involved as well.  He had been in a study that went on for 2 years.  Study ended this past May.  He had done quite well while in the study.  Pain and stiffness of slowly returned since that time.  He has not had rheumatology follow-up since the study ended.  Past Medical History:  Diagnosis Date  . Arthritis     History reviewed. No pertinent surgical history.  Family History  Problem Relation Age of Onset  . Diabetes Mother   . Heart disease Mother   . Arthritis Paternal Grandmother   . Arthritis Paternal Grandfather     Social History   Socioeconomic History  . Marital status: Single    Spouse name: Not on file  . Number of children: Not on file  . Years of education: Not on file  . Highest education level: Not on file  Occupational History  . Not on file  Tobacco Use  . Smoking status: Current Every Day Smoker    Packs/day: 0.25    Years: 13.00    Pack years: 3.25    Types: Cigarettes  . Smokeless tobacco: Never Used  . Tobacco comment: Black and mild  Substance and Sexual Activity  . Alcohol use: Yes    Alcohol/week: 0.0 standard drinks  . Drug use: Yes    Frequency: 1.0 times per week    Types: Marijuana  . Sexual activity: Yes  Other Topics Concern  . Not on file  Social History Narrative  . Not on file   Social Determinants of Health   Financial Resource Strain:   . Difficulty of Paying Living Expenses:  Not on file  Food Insecurity:   . Worried About Programme researcher, broadcasting/film/video in the Last Year: Not on file  . Ran Out of Food in the Last Year: Not on file  Transportation Needs:   . Lack of Transportation (Medical): Not on file  . Lack of Transportation (Non-Medical): Not on file  Physical Activity:   . Days of Exercise per Week: Not on file  . Minutes of Exercise per Session: Not on file  Stress:   . Feeling of Stress : Not on file  Social Connections:   . Frequency of Communication with Friends and Family: Not on file  . Frequency of Social Gatherings with Friends and Family: Not on file  . Attends Religious Services: Not on file  . Active Member of Clubs or Organizations: Not on file  . Attends Banker Meetings: Not on file  . Marital Status: Not on file  Intimate Partner Violence:   . Fear of Current or Ex-Partner: Not on file  . Emotionally Abused: Not on file  . Physically Abused: Not on file  . Sexually Abused: Not on file    ROS Review of Systems  Constitutional: Negative.   Respiratory: Negative.   Cardiovascular: Negative.  Gastrointestinal: Negative.   Genitourinary: Negative.   Musculoskeletal: Positive for arthralgias, back pain and gait problem.  Neurological: Negative for weakness.  Psychiatric/Behavioral: Negative.     Objective:   Today's Vitals: BP 132/72   Pulse 92   Temp (!) 97.1 F (36.2 C) (Tympanic)   Ht 5\' 7"  (1.702 m)   Wt 143 lb 6.4 oz (65 kg)   SpO2 100%   BMI 22.46 kg/m   Physical Exam Vitals and nursing note reviewed.  Constitutional:      General: He is not in acute distress.    Appearance: Normal appearance. He is normal weight. He is not ill-appearing, toxic-appearing or diaphoretic.  HENT:     Head: Normocephalic and atraumatic.     Right Ear: External ear normal.     Left Ear: External ear normal.  Eyes:     General: No scleral icterus.       Right eye: No discharge.        Left eye: No discharge.  Pulmonary:      Effort: Pulmonary effort is normal.  Musculoskeletal:     Thoracic back: Deformity present. No spasms, tenderness or bony tenderness. Decreased range of motion.     Lumbar back: No tenderness or bony tenderness. Decreased range of motion.     Right hip: Bony tenderness present. Decreased range of motion.  Neurological:     Mental Status: He is alert and oriented to person, place, and time.  Psychiatric:        Mood and Affect: Mood normal.        Behavior: Behavior normal.     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 methocarbamol (ROBAXIN) 500 MG tablet Take 1 tablet (500 mg total) by mouth every 8 (eight) hours as needed for muscle spasms.  . naproxen sodium (ANAPROX) 220 MG tablet Take 220 mg by mouth 2 (two) times daily with a meal.  . predniSONE (STERAPRED UNI-PAK 48 TAB) 10 MG (48) TBPK tablet Pharm to instruct a 12 day dose pack  . [DISCONTINUED] methocarbamol (ROBAXIN) 500 MG tablet Take 1 tablet (500 mg total) by mouth 2 (two) times daily. (Patient not taking: Reported on 02/23/2019)  . [DISCONTINUED] methocarbamol (ROBAXIN) 500 MG tablet Take 1 tablet (500 mg total) by mouth every 8 (eight) hours as needed for muscle spasms.  . [DISCONTINUED] predniSONE (STERAPRED UNI-PAK 48 TAB) 10 MG (48) TBPK tablet Pharm to instruct a 12 day dose pack   No  facility-administered encounter medications on file as of 02/23/2019.    Follow-up: Return in about 6 weeks (around 04/06/2019), or return fasting for physical exam..    Libby Maw, MD

## 2019-02-23 NOTE — Patient Instructions (Signed)
Ankylosing Spondylitis, Adult  Ankylosing spondylitis (AS) is a long-term (chronic) condition that causes swelling and irritation (inflammation), often in the spine. Over time, the inflammation can make new bone form in the spine. This can result in the spinal joints fusing together (ankylosis) and loss of movement. In some people, inflammation may affect other areas of the body, such as the shoulders, hips, ribs, and small joints in the hands and feet. Sometimes the eyes are affected. Inflammation can also happen in the joints of the back and in the tissues that connect joints to bones (ligaments) or connect muscles to bones (tendons). As the disease gets worse, the joints that connect the spine to the pelvis (sacroiliac or SIjoints) are often affected. The condition can range from mild to severe. You may have more severe AS if you smoke. What are the causes? The exact cause of this condition is not known. It may be caused by abnormal genes that are passed down through families. The most common gene that has been linked to AS produces a protein called HLA-B27. Even if you have genes for AS, they may need to be triggered to become active. Infection may be one trigger. What increases the risk? You are more likely to develop this condition if:  You have a family history of AS.  You are between the ages of 13 and 5.  You are male.  You have frequent gastrointestinal infections. What are the signs or symptoms? The most common symptoms are pain and stiffness that get worse with rest and better with movement. Pain may be worse at night, and stiffness may be worse in the morning. Symptoms also depend on where the inflammation occurs:  Inflammation of the SI joints causes pain in the lower back and buttocks. You may also feel pain in your hips.  Inflammation in the upper spine, neck, and ribs causes pain in those areas. Rib inflammation may cause shortness of breath.  Inflammation in the shoulder,  fingers, knees, ankles, toes, or heels may cause pain and stiffness in those areas.  Inflammation in the eyes may cause eye pain, redness, or visual disturbances.  Generalized inflammation may cause fever, loss of appetite, and fatigue. How is this diagnosed? This condition may be diagnosed based on:  Your symptoms and your medical and family history.  A physical exam.  Tests, such as: ? X-rays or an MRI to look for joint changes or inflammation. ? A blood test to check for the protein HLA-B27. You may be referred to a health care provider who specializes in diseases of the bones, muscles, and joints (rheumatologist). How is this treated? There is no cure for this condition, but treatment can reduce symptoms. Treatment options may include:  Medicines, such as: ? Nonsteroidal anti-inflammatory drugs (NSAIDs). These may be used first to reduce pain and inflammation. ? Steroid shots into inflamed joints to help reverse inflammation. ? Disease-modifying antirheumatic drugs (DMARDs). These may reduce symptoms and slow the progression of the disease. ? Biologic medicines. These may be used for moderate to severe forms of AS and when other treatments are not helping. These medicines are the most effective but may increase the risk for a serious infection.  Physical therapy and physical activity to help keep muscles strong and keep joints from getting stiff.  Surgery may be needed for severe joint damage, usually in the hip. This may require hip replacement surgery. Follow these instructions at home: Activity  Return to your normal activities as told by your health  care provider. Ask your health care provider what activities are safe for you.  Try to get exercise every day. Exercise is very important when you have AS. If you have learned physical therapy exercises, practice them as told by your physical therapist.  Take care to avoid falls. When appropriate, use a cane or walker. Remove  area rugs from your home. General instructions  Take over-the-counter and prescription medicines only as told by your health care provider.  Maintain good posture when standing and sitting.  Maintain a healthy weight.  Wear shoes that are supportive and well fitting.  Do not use any products that contain nicotine or tobacco, such as cigarettes and e-cigarettes. These can make your condition worse. If you need help quitting, ask your health care provider.  Keep all follow-up visits as told by your health care provider. This is important. Where to find more information Learn as much as you can about AS. You can find support and information from the Spondylitis Association of [place]: www.spondylitis.org Contact a health care provider if:  You are on a biologic medicine and you have a fever or any other signs of infection.  You have side effects from any of your medicines.  Your symptoms are not improving with medicine or are getting worse. Get help right away if:  You have trouble breathing.  You have a sudden change in vision. Summary  Ankylosing spondylitis (AS) is a long-term condition that causes inflammation, often in the spine.  AS may be caused by abnormal genes that can be passed down through families.  The most common symptoms of AS are pain and stiffness that get worse with rest and better with movement.  There is no cure for AS, but treatment can control symptoms and slow progression of the disease.  Nonsteroidal anti-inflammatory drugs (NSAIDs) may be the first treatment used. These help reduce pain and inflammation. This information is not intended to replace advice given to you by your health care provider. Make sure you discuss any questions you have with your health care provider. Document Revised: 08/09/2018 Document Reviewed: 03/23/2017 Elsevier Patient Education  2020 [employer].

## 2019-02-26 ENCOUNTER — Other Ambulatory Visit: Payer: Self-pay | Admitting: Family Medicine

## 2019-02-26 ENCOUNTER — Other Ambulatory Visit: Payer: 59

## 2019-02-26 ENCOUNTER — Other Ambulatory Visit: Payer: Self-pay

## 2019-02-26 ENCOUNTER — Ambulatory Visit (INDEPENDENT_AMBULATORY_CARE_PROVIDER_SITE_OTHER)
Admission: RE | Admit: 2019-02-26 | Discharge: 2019-02-26 | Disposition: A | Discharge status: Home / Self Care | Payer: 59 | Source: Ambulatory Visit | Attending: Family Medicine | Admitting: Family Medicine

## 2019-02-26 DIAGNOSIS — M25551 Pain in right hip: Secondary | ICD-10-CM

## 2019-02-26 DIAGNOSIS — G8929 Other chronic pain: Secondary | ICD-10-CM

## 2019-03-27 ENCOUNTER — Telehealth: Payer: Self-pay | Admitting: Family Medicine

## 2019-03-27 NOTE — Telephone Encounter (Signed)
Patient is calling and requested if another referral be put in to be seen at  another Rheumatologist Office.Also patient is requesting a refill for prednisone be sent to [facility] on [facility]. CB is [phone]

## 2019-04-02 NOTE — Telephone Encounter (Signed)
Patient calling for refill on Prednisone and would like a referral to Rheumatology. He has an appointment on 04/06/19. Please advise

## 2019-04-03 NOTE — Telephone Encounter (Signed)
Spoke with patient who states that he was supposed to be referred to Rheumatology and he have not heard from anyone to schedule an appointment pt calling to see if he could be referred somewhere else since he have not gotten an appointment. Patient aware doctor will go over refill for prednisone at his visit in a few days. Patient states that he is still having lots of leg pains.

## 2019-04-03 NOTE — Telephone Encounter (Signed)
He has an appointment on the 5th. That is in 3 days. Would prefer for him to wait rheumatology suggestion.

## 2019-04-05 ENCOUNTER — Other Ambulatory Visit: Payer: Self-pay

## 2019-04-06 ENCOUNTER — Ambulatory Visit (INDEPENDENT_AMBULATORY_CARE_PROVIDER_SITE_OTHER): Payer: 59 | Admitting: Family Medicine

## 2019-04-06 ENCOUNTER — Encounter: Payer: Self-pay | Admitting: Family Medicine

## 2019-04-06 VITALS — BP 132/76 | HR 60 | Temp 97.3°F | Ht 67.0 in

## 2019-04-06 DIAGNOSIS — M450 Ankylosing spondylitis of multiple sites in spine: Secondary | ICD-10-CM

## 2019-04-06 MED ORDER — PREDNISONE 10 MG (48) PO TBPK: ORAL_TABLET | ORAL | 0 refills | Status: AC

## 2019-04-06 MED ORDER — PREDNISONE 5 MG PO TABS: 5.0000 mg | ORAL_TABLET | Freq: Every day | ORAL | 1 refills | Status: AC

## 2019-04-06 NOTE — Progress Notes (Signed)
Established Patient Office Visit  Subjective:  Patient ID: Jimmy Peterson, male    DOB: 05-19-1980  Age: 39 y.o. MRN: [id number]  CC:  Chief Complaint  Patient presents with  . Annual Exam    CPE, patient would like to know if he could start on trial medication     HPI Jimmy Peterson presents for follow-up of his ankylosing spondylitis and associated pain in multiple joints.  He was much improved while taking the 12-day Dosepak.  He had been referred to rheumatology but unfortunately never received a follow-up appointment.  Past Medical History:  Diagnosis Date  . Arthritis     History reviewed. No pertinent surgical history.  Family History  Problem Relation Age of Onset  . Diabetes Mother   . Heart disease Mother   . Arthritis Paternal Grandmother   . Arthritis Paternal Grandfather     Social History   Socioeconomic History  . Marital status: Single    Spouse name: Not on file  . Number of children: Not on file  . Years of education: Not on file  . Highest education level: Not on file  Occupational History  . Not on file  Tobacco Use  . Smoking status: Current Every Day Smoker    Packs/day: 0.25    Years: 13.00    Pack years: 3.25    Types: Cigarettes  . Smokeless tobacco: Never Used  . Tobacco comment: Black and mild  Substance and Sexual Activity  . Alcohol use: Yes    Alcohol/week: 0.0 standard drinks  . Drug use: Yes    Frequency: 1.0 times per week    Types: Marijuana  . Sexual activity: Yes  Other Topics Concern  . Not on file  Social History Narrative  . Not on file   Social Determinants of Health   Financial Resource Strain:   . Difficulty of Paying Living Expenses: Not on file  Food Insecurity:   . Worried About Programme researcher, broadcasting/film/video in the Last Year: Not on file  . Ran Out of Food in the Last Year: Not on file  Transportation Needs:   . Lack of Transportation (Medical): Not on file  . Lack of Transportation (Non-Medical):  Not on file  Physical Activity:   . Days of Exercise per Week: Not on file  . Minutes of Exercise per Session: Not on file  Stress:   . Feeling of Stress : Not on file  Social Connections:   . Frequency of Communication with Friends and Family: Not on file  . Frequency of Social Gatherings with Friends and Family: Not on file  . Attends Religious Services: Not on file  . Active Member of Clubs or Organizations: Not on file  . Attends Banker Meetings: Not on file  . Marital Status: Not on file  Intimate Partner Violence:   . Fear of Current or Ex-Partner: Not on file  . Emotionally Abused: Not on file  . Physically Abused: Not on file  . Sexually Abused: Not on file    Outpatient Medications Prior to Visit  Medication Sig Dispense Refill  . naproxen sodium (ANAPROX) 220 MG tablet Take 220 mg by mouth 2 (two) times daily with a meal.    . predniSONE (STERAPRED UNI-PAK 48 TAB) 10 MG (48) TBPK tablet Pharm to instruct a 12 day dose pack 48 tablet 0  . methocarbamol (ROBAXIN) 500 MG tablet Take 1 tablet (500 mg total) by mouth every 8 (eight) hours as  needed for muscle spasms. (Patient not taking: Reported on 04/06/2019) 60 tablet 0   No facility-administered medications prior to visit.    Allergies  Allergen Reactions  . Aspirin Hives  . Penicillins     Has patient had a PCN reaction causing immediate rash, facial/tongue/throat swelling, SOB or lightheadedness with hypotension: Unknown Has patient had a PCN reaction causing severe rash involving mucus membranes or skin necrosis: Unknown Has patient had a PCN reaction that required hospitalization: Unknown Has patient had a PCN reaction occurring within the last 10 years: Unknown If all of the above answers are "NO", then may proceed with Cephalosporin use.     ROS Review of Systems  Constitutional: Negative.   Respiratory: Negative.   Cardiovascular: Negative.   Genitourinary: Negative.   Musculoskeletal:  Positive for arthralgias, back pain and gait problem.  Neurological: Negative for speech difficulty and weakness.  Psychiatric/Behavioral: Negative.       Objective:    Physical Exam  Constitutional: He is oriented to person, place, and time. He appears well-developed and well-nourished. No distress.  HENT:  Head: Normocephalic and atraumatic.  Right Ear: External ear normal.  Left Ear: External ear normal.  Eyes: Conjunctivae are normal. Right eye exhibits no discharge. Left eye exhibits no discharge. No scleral icterus.  Neck: No JVD present. No tracheal deviation present.  Pulmonary/Chest: Effort normal. No stridor.  Neurological: He is alert and oriented to person, place, and time.  Skin: He is not diaphoretic.  Psychiatric: He has a normal mood and affect. His behavior is normal.    BP 132/76   Pulse 60   Temp (!) 97.3 F (36.3 C) (Tympanic)   Ht 5\' 7"  (1.702 m)   SpO2 95%   BMI 22.46 kg/m  Wt Readings from Last 3 Encounters:  02/23/19 143 lb 6.4 oz (65 kg)  02/06/13 165 lb (74.8 kg)  09/20/12 173 lb (78.5 kg)     Health Maintenance Due  Topic Date Due  . HIV Screening  01/14/1996    There are no preventive care reminders to display for this patient.  No results found for: TSH Lab Results  Component Value Date   WBC 7.4 08/27/2012   HGB 11.3 (L) 08/27/2012   HCT 33.8 (L) 08/27/2012   MCV 85.6 08/27/2012   PLT 256 08/27/2012   Lab Results  Component Value Date   NA 138 08/29/2012   K 3.5 08/29/2012   CO2 30 08/29/2012   GLUCOSE 75 08/29/2012   BUN 12 08/29/2012   CREATININE 1.07 08/29/2012   BILITOT 0.1 (L) 08/27/2012   ALKPHOS 82 08/27/2012   AST 8 08/27/2012   ALT 6 08/27/2012   PROT 6.8 08/27/2012   ALBUMIN 3.1 (L) 08/27/2012   CALCIUM 8.9 08/29/2012   No results found for: CHOL No results found for: HDL No results found for: LDLCALC No results found for: TRIG No results found for: CHOLHDL No results found for: 08/31/2012    Assessment &  Plan:   Problem List Items Addressed This Visit      Musculoskeletal and Integument   Ankylosing spondylitis of multiple sites in spine (HCC) - Primary   Relevant Medications   predniSONE (STERAPRED UNI-PAK 48 TAB) 10 MG (48) TBPK tablet   predniSONE (DELTASONE) 5 MG tablet   Other Relevant Orders   Ambulatory referral to Rheumatology      Meds ordered this encounter  Medications  . predniSONE (STERAPRED UNI-PAK 48 TAB) 10 MG (48) TBPK tablet  Sig: Pharmacy to instruct a 12 day dose pack.    Dispense:  48 tablet    Refill:  0  . predniSONE (DELTASONE) 5 MG tablet    Sig: Take 1 tablet (5 mg total) by mouth daily with breakfast. To start after finishing the 12 day dose pack.    Dispense:  30 tablet    Refill:  1    Follow-up: Return Call [facility] here to check on referral status..  Patient will take the 12-day Dosepak and then again 5 mg of prednisone daily until he sees [facility] rheumatology.  Libby Maw, MD

## 2019-04-06 NOTE — Patient Instructions (Signed)
Ankylosing Spondylitis, Adult  Ankylosing spondylitis (AS) is a long-term (chronic) condition that causes swelling and irritation (inflammation), often in the spine. Over time, the inflammation can make new bone form in the spine. This can result in the spinal joints fusing together (ankylosis) and loss of movement. In some people, inflammation may affect other areas of the body, such as the shoulders, hips, ribs, and small joints in the hands and feet. Sometimes the eyes are affected. Inflammation can also happen in the joints of the back and in the tissues that connect joints to bones (ligaments) or connect muscles to bones (tendons). As the disease gets worse, the joints that connect the spine to the pelvis (sacroiliac or SIjoints) are often affected. The condition can range from mild to severe. You may have more severe AS if you smoke. What are the causes? The exact cause of this condition is not known. It may be caused by abnormal genes that are passed down through families. The most common gene that has been linked to AS produces a protein called HLA-B27. Even if you have genes for AS, they may need to be triggered to become active. Infection may be one trigger. What increases the risk? You are more likely to develop this condition if:  You have a family history of AS.  You are between the ages of 3 and 74.  You are male.  You have frequent gastrointestinal infections. What are the signs or symptoms? The most common symptoms are pain and stiffness that get worse with rest and better with movement. Pain may be worse at night, and stiffness may be worse in the morning. Symptoms also depend on where the inflammation occurs:  Inflammation of the SI joints causes pain in the lower back and buttocks. You may also feel pain in your hips.  Inflammation in the upper spine, neck, and ribs causes pain in those areas. Rib inflammation may cause shortness of breath.  Inflammation in the shoulder,  fingers, knees, ankles, toes, or heels may cause pain and stiffness in those areas.  Inflammation in the eyes may cause eye pain, redness, or visual disturbances.  Generalized inflammation may cause fever, loss of appetite, and fatigue. How is this diagnosed? This condition may be diagnosed based on:  Your symptoms and your medical and family history.  A physical exam.  Tests, such as: ? X-rays or an MRI to look for joint changes or inflammation. ? A blood test to check for the protein HLA-B27. You may be referred to a health care provider who specializes in diseases of the bones, muscles, and joints (rheumatologist). How is this treated? There is no cure for this condition, but treatment can reduce symptoms. Treatment options may include:  Medicines, such as: ? Nonsteroidal anti-inflammatory drugs (NSAIDs). These may be used first to reduce pain and inflammation. ? Steroid shots into inflamed joints to help reverse inflammation. ? Disease-modifying antirheumatic drugs (DMARDs). These may reduce symptoms and slow the progression of the disease. ? Biologic medicines. These may be used for moderate to severe forms of AS and when other treatments are not helping. These medicines are the most effective but may increase the risk for a serious infection.  Physical therapy and physical activity to help keep muscles strong and keep joints from getting stiff.  Surgery may be needed for severe joint damage, usually in the hip. This may require hip replacement surgery. Follow these instructions at home: Activity  Return to your normal activities as told by your health  care provider. Ask your health care provider what activities are safe for you.  Try to get exercise every day. Exercise is very important when you have AS. If you have learned physical therapy exercises, practice them as told by your physical therapist.  Take care to avoid falls. When appropriate, use a cane or walker. Remove  area rugs from your home. General instructions  Take over-the-counter and prescription medicines only as told by your health care provider.  Maintain good posture when standing and sitting.  Maintain a healthy weight.  Wear shoes that are supportive and well fitting.  Do not use any products that contain nicotine or tobacco, such as cigarettes and e-cigarettes. These can make your condition worse. If you need help quitting, ask your health care provider.  Keep all follow-up visits as told by your health care provider. This is important. Where to find more information Learn as much as you can about AS. You can find support and information from the Spondylitis Association of [place]: www.spondylitis.org Contact a health care provider if:  You are on a biologic medicine and you have a fever or any other signs of infection.  You have side effects from any of your medicines.  Your symptoms are not improving with medicine or are getting worse. Get help right away if:  You have trouble breathing.  You have a sudden change in vision. Summary  Ankylosing spondylitis (AS) is a long-term condition that causes inflammation, often in the spine.  AS may be caused by abnormal genes that can be passed down through families.  The most common symptoms of AS are pain and stiffness that get worse with rest and better with movement.  There is no cure for AS, but treatment can control symptoms and slow progression of the disease.  Nonsteroidal anti-inflammatory drugs (NSAIDs) may be the first treatment used. These help reduce pain and inflammation. This information is not intended to replace advice given to you by your health care provider. Make sure you discuss any questions you have with your health care provider. Document Revised: 08/09/2018 Document Reviewed: 03/23/2017 Elsevier Patient Education  [place]. Prednisone tablets What is this medicine? PREDNISONE (PRED ni sone) is a  corticosteroid. It is commonly used to treat inflammation of the skin, joints, lungs, and other organs. Common conditions treated include asthma, allergies, and arthritis. It is also used for other conditions, such as blood disorders and diseases of the adrenal glands. This medicine may be used for other purposes; ask your health care provider or pharmacist if you have questions. COMMON BRAND NAME(S): Deltasone, Predone, Sterapred, Sterapred DS What should I tell my health care provider before I take this medicine? They need to know if you have any of these conditions:  Cushing's syndrome  diabetes  glaucoma  heart disease  high blood pressure  infection (especially a virus infection such as chickenpox, cold sores, or herpes)  kidney disease  liver disease  mental illness  myasthenia gravis  osteoporosis  seizures  stomach or intestine problems  thyroid disease  an unusual or allergic reaction to lactose, prednisone, other medicines, foods, dyes, or preservatives  pregnant or trying to get pregnant  breast-feeding How should I use this medicine? Take this medicine by mouth with a glass of water. Follow the directions on the prescription label. Take this medicine with food. If you are taking this medicine once a day, take it in the morning. Do not take more medicine than you are told to take. Do  not suddenly stop taking your medicine because you may develop a severe reaction. Your doctor will tell you how much medicine to take. If your doctor wants you to stop the medicine, the dose may be slowly lowered over time to avoid any side effects. Talk to your pediatrician regarding the use of this medicine in children. Special care may be needed. Overdosage: If you think you have taken too much of this medicine contact a poison control center or emergency room at once. NOTE: This medicine is only for you. Do not share this medicine with others. What if I miss a dose? If you miss  a dose, take it as soon as you can. If it is almost time for your next dose, talk to your doctor or health care professional. You may need to miss a dose or take an extra dose. Do not take double or extra doses without advice. What may interact with this medicine? Do not take this medicine with any of the following medications:  metyrapone  mifepristone This medicine may also interact with the following medications:  aminoglutethimide  amphotericin B  aspirin and aspirin-like medicines  barbiturates  certain medicines for diabetes, like glipizide or glyburide  cholestyramine  cholinesterase inhibitors  cyclosporine  digoxin  diuretics  ephedrine  male hormones, like estrogens and birth control pills  isoniazid  ketoconazole  NSAIDS, medicines for pain and inflammation, like ibuprofen or naproxen  phenytoin  rifampin  toxoids  vaccines  warfarin This list may not describe all possible interactions. Give your health care provider a list of all the medicines, herbs, non-prescription drugs, or dietary supplements you use. Also tell them if you smoke, drink alcohol, or use illegal drugs. Some items may interact with your medicine. What should I watch for while using this medicine? Visit your doctor or health care professional for regular checks on your progress. If you are taking this medicine over a prolonged period, carry an identification card with your name and address, the type and dose of your medicine, and your doctor's name and address. This medicine may increase your risk of getting an infection. Tell your doctor or health care professional if you are around anyone with measles or chickenpox, or if you develop sores or blisters that do not heal properly. If you are going to have surgery, tell your doctor or health care professional that you have taken this medicine within the last twelve months. Ask your doctor or health care professional about your diet.  You may need to lower the amount of salt you eat. This medicine may increase blood sugar. Ask your healthcare provider if changes in diet or medicines are needed if you have diabetes. What side effects may I notice from receiving this medicine? Side effects that you should report to your doctor or health care professional as soon as possible:  allergic reactions like skin rash, itching or hives, swelling of the face, lips, or tongue  changes in emotions or moods  changes in vision  depressed mood  eye pain  fever or chills, cough, sore throat, pain or difficulty passing urine  signs and symptoms of high blood sugar such as being more thirsty or hungry or having to urinate more than normal. You may also feel very tired or have blurry vision.  swelling of ankles, feet Side effects that usually do not require medical attention (report to your doctor or health care professional if they continue or are bothersome):  confusion, excitement, restlessness  headache  nausea, vomiting  skin problems, acne, thin and shiny skin  trouble sleeping  weight gain This list may not describe all possible side effects. Call your doctor for medical advice about side effects. You may report side effects to FDA at 1-800-FDA-1088. Where should I keep my medicine? Keep out of the reach of children. Store at room temperature between 15 and 30 degrees C (59 and 86 degrees F). Protect from light. Keep container tightly closed. Throw away any unused medicine after the expiration date. NOTE: This sheet is a summary. It may not cover all possible information. If you have questions about this medicine, talk to your doctor, pharmacist, or health care provider.  2020 Elsevier/Gold Standard (2017-10-18 10:54:22)

## 2019-05-17 ENCOUNTER — Telehealth: Payer: Self-pay | Admitting: Family Medicine

## 2019-05-17 NOTE — Telephone Encounter (Signed)
Patient is calling and requesting a call back regarding medication. CB is [phone]

## 2019-05-17 NOTE — Telephone Encounter (Signed)
Spoke with patient who states that he was able to speak with some from rheumatology and the informed patient that he had a past due balance with them and he will not be able to be seen until this is taken care of. Patient did say that they let him know that he was prescribed Cosentyx for ankylosing spondylitis (AS) while he was being seen there. Patient calling to see if Dr. Doreene Burke would be able to prescribe this medication for him to help with his condition. Patient aware that Dr. Doreene Burke is not in the office until next week. Please advise.

## 2020-07-17 IMAGING — DX DG HIP (WITH OR WITHOUT PELVIS) 2-3V*R*
3 series · 3 of 3 positions shown · non-contrast
Comparison: November 07, 2012

CLINICAL DATA: Status post trauma.

EXAM:
DG HIP (WITH OR WITHOUT PELVIS) 2-3V RIGHT

[pelvis ap]
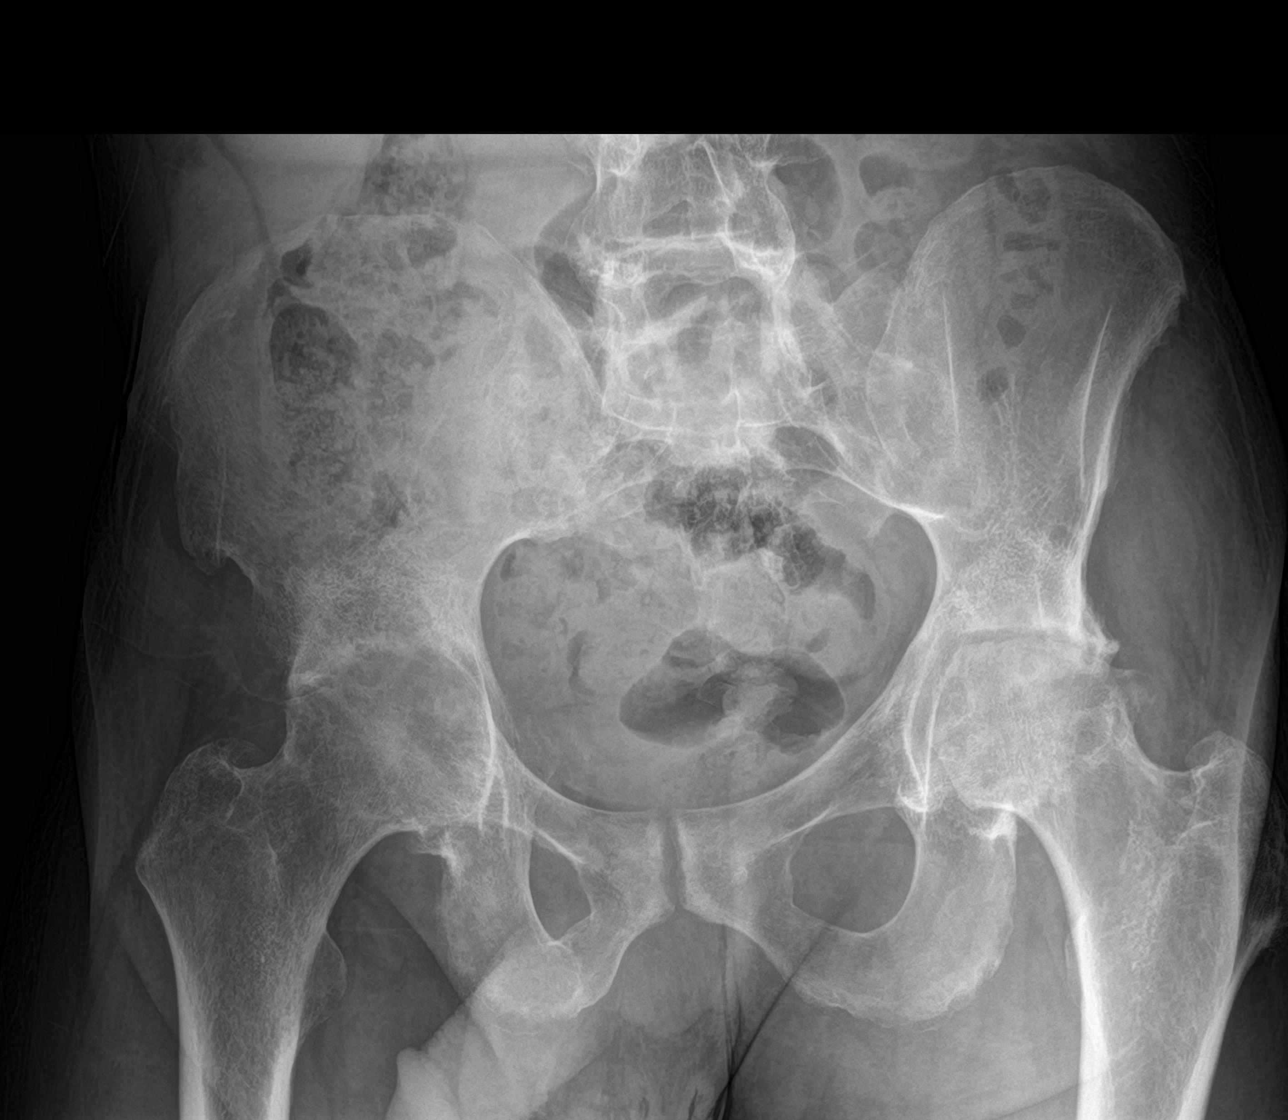

[hip ap]
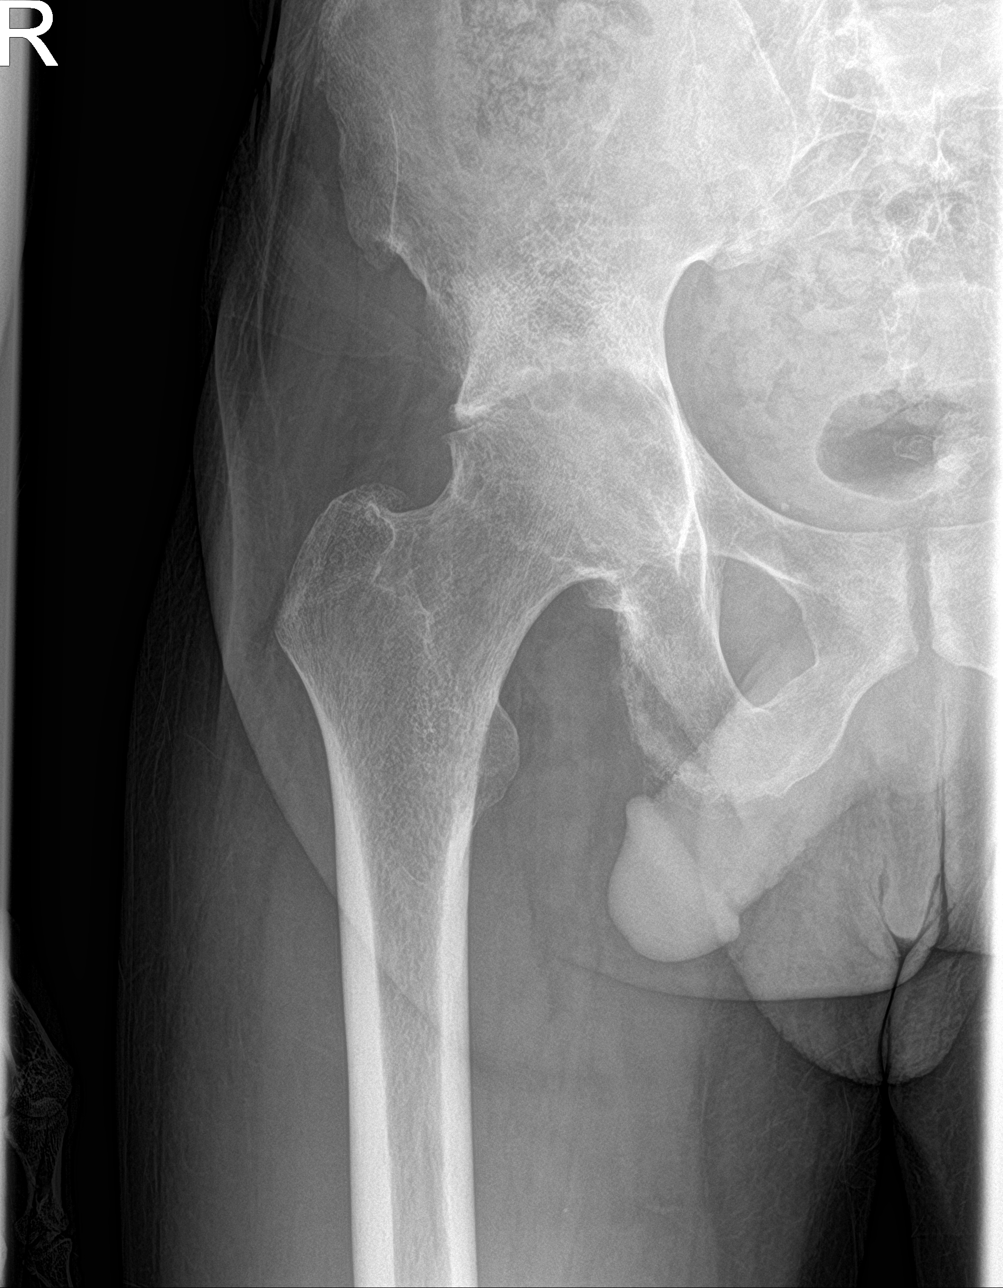

[hip lat]
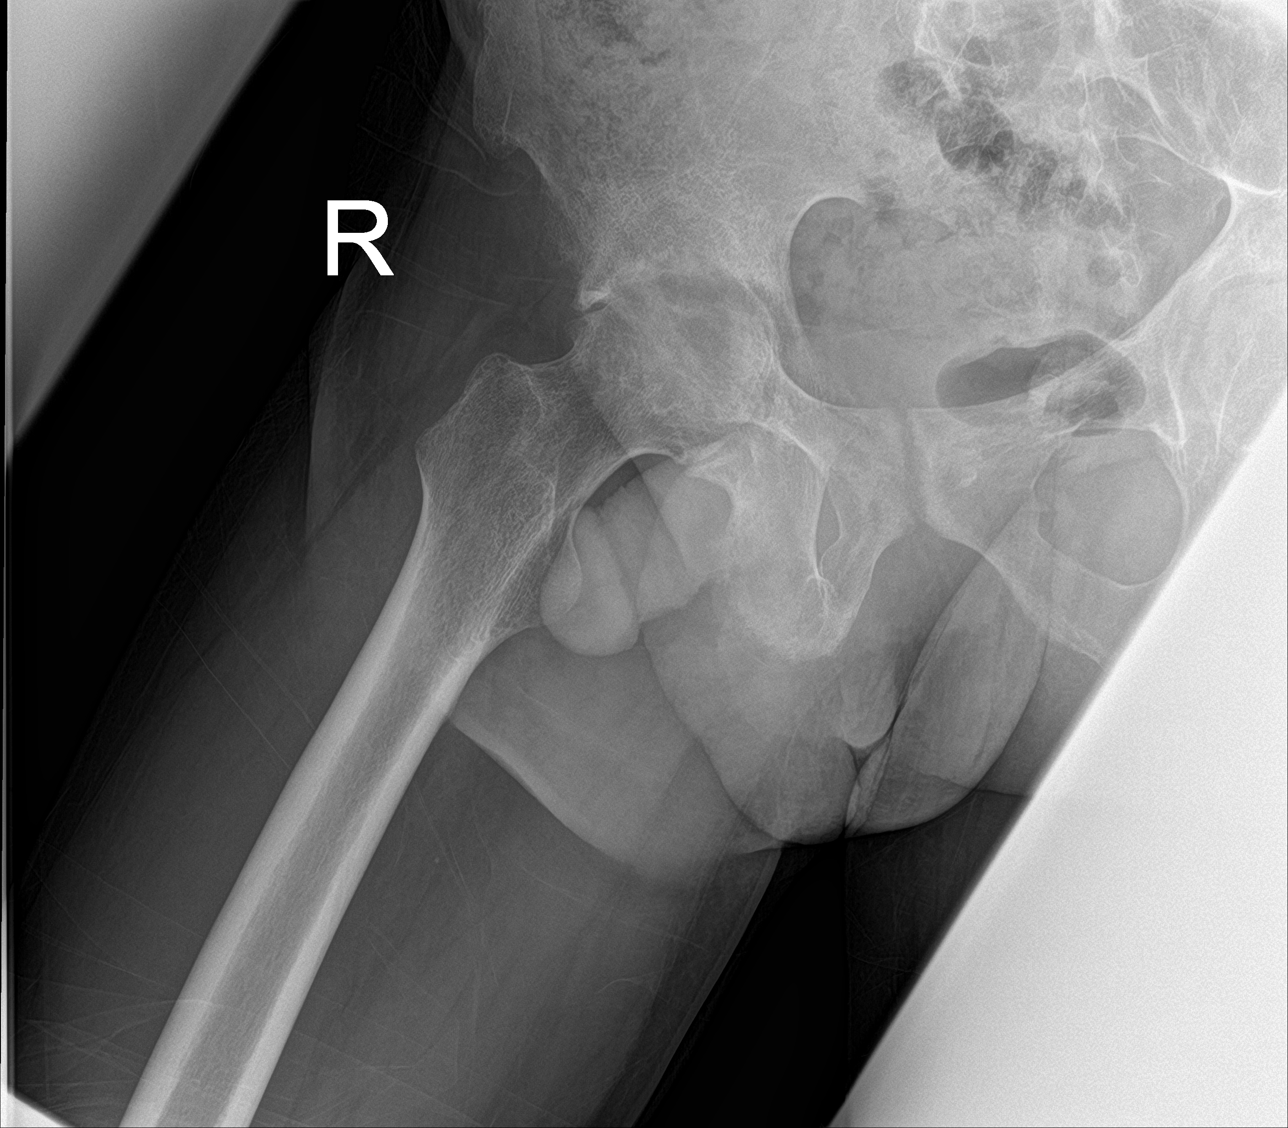

[3 of 3 positions shown; findings below may reference images not displayed]

FINDINGS: There is no evidence of an acute hip fracture or dislocation. A
chronic fracture deformity is seen along the right inferior pubic
ramus. Marked severity sclerotic changes are seen involving the
right acetabulum and left acetabulum. Marked severity joint space
narrowing is seen involving both hips. Soft tissue structures are
unremarkable.
IMPRESSION: 1. Chronic fracture deformity of the right inferior pubic ramus.
2. Marked severity degenerative changes involving both hips.

## 2024-01-09 DIAGNOSIS — R5383 Other fatigue: Secondary | ICD-10-CM | POA: Diagnosis not present

## 2024-01-09 DIAGNOSIS — Z6825 Body mass index (BMI) 25.0-25.9, adult: Secondary | ICD-10-CM | POA: Diagnosis not present

## 2024-01-09 DIAGNOSIS — Z1589 Genetic susceptibility to other disease: Secondary | ICD-10-CM | POA: Diagnosis not present

## 2024-01-09 DIAGNOSIS — E663 Overweight: Secondary | ICD-10-CM | POA: Diagnosis not present

## 2024-01-09 DIAGNOSIS — Z111 Encounter for screening for respiratory tuberculosis: Secondary | ICD-10-CM | POA: Diagnosis not present

## 2024-01-09 DIAGNOSIS — M25552 Pain in left hip: Secondary | ICD-10-CM | POA: Diagnosis not present

## 2024-01-09 DIAGNOSIS — M25551 Pain in right hip: Secondary | ICD-10-CM | POA: Diagnosis not present

## 2024-01-09 DIAGNOSIS — M456 Ankylosing spondylitis lumbar region: Secondary | ICD-10-CM | POA: Diagnosis not present

## 2024-01-09 DIAGNOSIS — M87052 Idiopathic aseptic necrosis of left femur: Secondary | ICD-10-CM | POA: Diagnosis not present

## 2024-03-08 DIAGNOSIS — Y249XXA Unspecified firearm discharge, undetermined intent, initial encounter: Secondary | ICD-10-CM

## 2024-03-08 MED ADMIN — Cefazolin Sodium-Dextrose IV Solution 2 GM/100ML-4%: 2 g | INTRAVENOUS | NDC 00338350841

## 2024-03-08 MED ADMIN — Oxycodone w/ Acetaminophen Tab 5-325 MG: 1 | ORAL | NDC 00406051223

## 2024-03-08 MED ADMIN — Tet Tox-Diph-Acell Pertuss Ad Inj 5-2-15.5 LF-LF-MCG/0.5ML: 0.5 mL | INTRAMUSCULAR | NDC 49281040089
